# Patient Record
Sex: Female | Born: 1955 | Race: Black or African American | Hispanic: No | Marital: Married | Smoking: Former smoker
Health system: Southern US, Community
[De-identification: ages and names within clinical notes are randomized; demographics above are authoritative.]

## PROBLEM LIST (undated history)

## (undated) DIAGNOSIS — M1990 Unspecified osteoarthritis, unspecified site: Secondary | ICD-10-CM

## (undated) DIAGNOSIS — T7840XA Allergy, unspecified, initial encounter: Secondary | ICD-10-CM

## (undated) DIAGNOSIS — M810 Age-related osteoporosis without current pathological fracture: Secondary | ICD-10-CM

## (undated) DIAGNOSIS — Z923 Personal history of irradiation: Secondary | ICD-10-CM

## (undated) DIAGNOSIS — C50912 Malignant neoplasm of unspecified site of left female breast: Secondary | ICD-10-CM

## (undated) DIAGNOSIS — Z803 Family history of malignant neoplasm of breast: Secondary | ICD-10-CM

## (undated) DIAGNOSIS — I10 Essential (primary) hypertension: Secondary | ICD-10-CM

## (undated) DIAGNOSIS — K219 Gastro-esophageal reflux disease without esophagitis: Secondary | ICD-10-CM

## (undated) DIAGNOSIS — Z9221 Personal history of antineoplastic chemotherapy: Secondary | ICD-10-CM

## (undated) DIAGNOSIS — I5041 Acute combined systolic (congestive) and diastolic (congestive) heart failure: Secondary | ICD-10-CM

## (undated) DIAGNOSIS — D869 Sarcoidosis, unspecified: Secondary | ICD-10-CM

## (undated) DIAGNOSIS — K589 Irritable bowel syndrome without diarrhea: Secondary | ICD-10-CM

## (undated) DIAGNOSIS — N200 Calculus of kidney: Secondary | ICD-10-CM

## (undated) DIAGNOSIS — Z87442 Personal history of urinary calculi: Secondary | ICD-10-CM

## (undated) DIAGNOSIS — F419 Anxiety disorder, unspecified: Secondary | ICD-10-CM

## (undated) DIAGNOSIS — C50219 Malignant neoplasm of upper-inner quadrant of unspecified female breast: Secondary | ICD-10-CM

## (undated) HISTORY — PX: PARTIAL HYSTERECTOMY: SHX80

## (undated) HISTORY — DX: Malignant neoplasm of upper-inner quadrant of unspecified female breast: C50.219

## (undated) HISTORY — DX: Calculus of kidney: N20.0

## (undated) HISTORY — PX: CHOLECYSTECTOMY: SHX55

## (undated) HISTORY — DX: Anxiety disorder, unspecified: F41.9

## (undated) HISTORY — DX: Unspecified osteoarthritis, unspecified site: M19.90

## (undated) HISTORY — DX: Gastro-esophageal reflux disease without esophagitis: K21.9

## (undated) HISTORY — PX: VESICOVAGINAL FISTULA CLOSURE W/ TAH: SUR271

## (undated) HISTORY — DX: Irritable bowel syndrome, unspecified: K58.9

## (undated) HISTORY — DX: Malignant neoplasm of unspecified site of left female breast: C50.912

## (undated) HISTORY — DX: Family history of malignant neoplasm of breast: Z80.3

## (undated) HISTORY — DX: Allergy, unspecified, initial encounter: T78.40XA

## (undated) HISTORY — PX: BREAST LUMPECTOMY: SHX2

## (undated) HISTORY — DX: Age-related osteoporosis without current pathological fracture: M81.0

---

## 1997-07-16 ENCOUNTER — Other Ambulatory Visit: Admission: RE | Admit: 1997-07-16 | Discharge: 1997-07-16 | Payer: Self-pay | Admitting: Obstetrics and Gynecology

## 1998-07-16 ENCOUNTER — Other Ambulatory Visit: Admission: RE | Admit: 1998-07-16 | Discharge: 1998-07-16 | Payer: Self-pay | Admitting: Obstetrics and Gynecology

## 1999-08-28 ENCOUNTER — Other Ambulatory Visit: Admission: RE | Admit: 1999-08-28 | Discharge: 1999-08-28 | Payer: Self-pay | Admitting: Obstetrics and Gynecology

## 2000-08-29 ENCOUNTER — Other Ambulatory Visit: Admission: RE | Admit: 2000-08-29 | Discharge: 2000-08-29 | Payer: Self-pay | Admitting: Obstetrics and Gynecology

## 2000-11-28 ENCOUNTER — Encounter: Payer: Self-pay | Admitting: Obstetrics and Gynecology

## 2000-11-28 ENCOUNTER — Encounter: Admission: RE | Admit: 2000-11-28 | Discharge: 2000-11-28 | Payer: Self-pay | Admitting: Obstetrics and Gynecology

## 2000-12-05 ENCOUNTER — Encounter: Payer: Self-pay | Admitting: General Surgery

## 2000-12-05 ENCOUNTER — Encounter: Admission: RE | Admit: 2000-12-05 | Discharge: 2000-12-05 | Payer: Self-pay | Admitting: General Surgery

## 2000-12-06 ENCOUNTER — Encounter: Payer: Self-pay | Admitting: General Surgery

## 2000-12-06 ENCOUNTER — Encounter (INDEPENDENT_AMBULATORY_CARE_PROVIDER_SITE_OTHER): Payer: Self-pay | Admitting: *Deleted

## 2000-12-06 ENCOUNTER — Ambulatory Visit (HOSPITAL_BASED_OUTPATIENT_CLINIC_OR_DEPARTMENT_OTHER): Admission: RE | Admit: 2000-12-06 | Discharge: 2000-12-07 | Payer: Self-pay | Admitting: General Surgery

## 2000-12-12 ENCOUNTER — Ambulatory Visit: Admission: RE | Admit: 2000-12-12 | Discharge: 2001-03-12 | Payer: Self-pay | Admitting: Radiation Oncology

## 2001-05-04 ENCOUNTER — Ambulatory Visit (HOSPITAL_COMMUNITY): Admission: RE | Admit: 2001-05-04 | Discharge: 2001-05-04 | Payer: Self-pay | Admitting: Oncology

## 2001-05-04 ENCOUNTER — Encounter (HOSPITAL_COMMUNITY): Payer: Self-pay | Admitting: Oncology

## 2001-06-08 ENCOUNTER — Ambulatory Visit (HOSPITAL_COMMUNITY): Admission: RE | Admit: 2001-06-08 | Discharge: 2001-06-08 | Payer: Self-pay | Admitting: Oncology

## 2001-06-08 ENCOUNTER — Encounter (HOSPITAL_COMMUNITY): Payer: Self-pay | Admitting: Oncology

## 2001-06-09 ENCOUNTER — Ambulatory Visit: Admission: RE | Admit: 2001-06-09 | Discharge: 2001-09-07 | Payer: Self-pay | Admitting: Radiation Oncology

## 2001-06-16 ENCOUNTER — Encounter: Admission: RE | Admit: 2001-06-16 | Discharge: 2001-06-16 | Payer: Self-pay | Admitting: Radiation Oncology

## 2001-08-29 ENCOUNTER — Other Ambulatory Visit: Admission: RE | Admit: 2001-08-29 | Discharge: 2001-08-29 | Payer: Self-pay | Admitting: Obstetrics and Gynecology

## 2001-11-27 ENCOUNTER — Encounter (HOSPITAL_COMMUNITY): Payer: Self-pay | Admitting: Oncology

## 2001-11-27 ENCOUNTER — Ambulatory Visit (HOSPITAL_COMMUNITY): Admission: RE | Admit: 2001-11-27 | Discharge: 2001-11-27 | Payer: Self-pay | Admitting: Oncology

## 2001-12-01 ENCOUNTER — Encounter (HOSPITAL_COMMUNITY): Payer: Self-pay | Admitting: Oncology

## 2001-12-01 ENCOUNTER — Encounter: Admission: RE | Admit: 2001-12-01 | Discharge: 2001-12-01 | Payer: Self-pay | Admitting: Oncology

## 2002-03-22 ENCOUNTER — Ambulatory Visit: Admission: RE | Admit: 2002-03-22 | Discharge: 2002-03-22 | Payer: Self-pay | Admitting: Radiation Oncology

## 2002-09-03 ENCOUNTER — Other Ambulatory Visit: Admission: RE | Admit: 2002-09-03 | Discharge: 2002-09-03 | Payer: Self-pay | Admitting: Obstetrics and Gynecology

## 2002-12-03 ENCOUNTER — Encounter (HOSPITAL_COMMUNITY): Payer: Self-pay | Admitting: Oncology

## 2002-12-03 ENCOUNTER — Encounter: Admission: RE | Admit: 2002-12-03 | Discharge: 2002-12-03 | Payer: Self-pay | Admitting: Oncology

## 2002-12-03 ENCOUNTER — Ambulatory Visit (HOSPITAL_COMMUNITY): Admission: RE | Admit: 2002-12-03 | Discharge: 2002-12-03 | Payer: Self-pay | Admitting: Oncology

## 2003-09-03 ENCOUNTER — Other Ambulatory Visit: Admission: RE | Admit: 2003-09-03 | Discharge: 2003-09-03 | Payer: Self-pay | Admitting: Obstetrics and Gynecology

## 2003-12-09 ENCOUNTER — Encounter: Admission: RE | Admit: 2003-12-09 | Discharge: 2003-12-09 | Payer: Self-pay | Admitting: Obstetrics and Gynecology

## 2004-01-19 ENCOUNTER — Ambulatory Visit: Payer: Self-pay | Admitting: Oncology

## 2004-01-20 ENCOUNTER — Ambulatory Visit (HOSPITAL_COMMUNITY): Admission: RE | Admit: 2004-01-20 | Discharge: 2004-01-20 | Payer: Self-pay | Admitting: Oncology

## 2004-05-19 ENCOUNTER — Ambulatory Visit: Payer: Self-pay | Admitting: Oncology

## 2004-09-02 ENCOUNTER — Other Ambulatory Visit: Admission: RE | Admit: 2004-09-02 | Discharge: 2004-09-02 | Payer: Self-pay | Admitting: Obstetrics and Gynecology

## 2004-11-13 ENCOUNTER — Ambulatory Visit: Payer: Self-pay | Admitting: Oncology

## 2004-12-10 ENCOUNTER — Encounter: Admission: RE | Admit: 2004-12-10 | Discharge: 2004-12-10 | Payer: Self-pay | Admitting: Oncology

## 2005-01-06 ENCOUNTER — Ambulatory Visit: Payer: Self-pay | Admitting: Family Medicine

## 2005-02-05 ENCOUNTER — Ambulatory Visit (HOSPITAL_COMMUNITY): Admission: RE | Admit: 2005-02-05 | Discharge: 2005-02-05 | Payer: Self-pay | Admitting: Oncology

## 2005-05-21 ENCOUNTER — Ambulatory Visit: Payer: Self-pay | Admitting: Oncology

## 2005-11-23 ENCOUNTER — Other Ambulatory Visit: Admission: RE | Admit: 2005-11-23 | Discharge: 2005-11-23 | Payer: Self-pay | Admitting: Obstetrics and Gynecology

## 2005-11-30 ENCOUNTER — Ambulatory Visit: Payer: Self-pay | Admitting: Oncology

## 2005-12-02 LAB — CBC WITH DIFFERENTIAL/PLATELET
BASO%: 0.3 % (ref 0.0–2.0)
Basophils Absolute: 0 10*3/uL (ref 0.0–0.1)
EOS%: 1.9 % (ref 0.0–7.0)
MCH: 30.8 pg (ref 26.0–34.0)
MCHC: 33.5 g/dL (ref 32.0–36.0)
MCV: 92 fL (ref 81.0–101.0)
MONO%: 13.4 % — ABNORMAL HIGH (ref 0.0–13.0)
RBC: 3.95 10*6/uL (ref 3.70–5.32)
RDW: 13.7 % (ref 11.3–14.5)
lymph#: 1 10*3/uL (ref 0.9–3.3)

## 2005-12-02 LAB — COMPREHENSIVE METABOLIC PANEL
ALT: 19 U/L (ref 0–40)
AST: 28 U/L (ref 0–37)
Albumin: 4.3 g/dL (ref 3.5–5.2)
Alkaline Phosphatase: 48 U/L (ref 39–117)
BUN: 15 mg/dL (ref 6–23)
Chloride: 103 mEq/L (ref 96–112)
Potassium: 4.3 mEq/L (ref 3.5–5.3)
Sodium: 142 mEq/L (ref 135–145)

## 2005-12-15 ENCOUNTER — Encounter: Admission: RE | Admit: 2005-12-15 | Discharge: 2005-12-15 | Payer: Self-pay | Admitting: Obstetrics and Gynecology

## 2005-12-28 ENCOUNTER — Encounter: Admission: RE | Admit: 2005-12-28 | Discharge: 2005-12-28 | Payer: Self-pay | Admitting: Oncology

## 2006-04-04 ENCOUNTER — Ambulatory Visit (HOSPITAL_COMMUNITY): Admission: RE | Admit: 2006-04-04 | Discharge: 2006-04-04 | Payer: Self-pay | Admitting: Oncology

## 2006-05-30 ENCOUNTER — Ambulatory Visit: Payer: Self-pay | Admitting: Oncology

## 2006-06-01 LAB — COMPREHENSIVE METABOLIC PANEL
Alkaline Phosphatase: 72 U/L (ref 39–117)
BUN: 15 mg/dL (ref 6–23)
Glucose, Bld: 76 mg/dL (ref 70–99)
Sodium: 139 mEq/L (ref 135–145)
Total Bilirubin: 0.4 mg/dL (ref 0.3–1.2)

## 2006-06-01 LAB — CBC WITH DIFFERENTIAL/PLATELET
Basophils Absolute: 0 10*3/uL (ref 0.0–0.1)
EOS%: 2.8 % (ref 0.0–7.0)
Eosinophils Absolute: 0.1 10*3/uL (ref 0.0–0.5)
LYMPH%: 17.1 % (ref 14.0–48.0)
MCH: 31 pg (ref 26.0–34.0)
MCV: 90.9 fL (ref 81.0–101.0)
MONO%: 4.3 % (ref 0.0–13.0)
NEUT#: 3.7 10*3/uL (ref 1.5–6.5)
Platelets: 224 10*3/uL (ref 145–400)
RBC: 4.18 10*6/uL (ref 3.70–5.32)

## 2006-12-23 ENCOUNTER — Encounter: Admission: RE | Admit: 2006-12-23 | Discharge: 2006-12-23 | Payer: Self-pay | Admitting: Obstetrics and Gynecology

## 2006-12-26 ENCOUNTER — Ambulatory Visit: Payer: Self-pay | Admitting: Oncology

## 2006-12-28 LAB — COMPREHENSIVE METABOLIC PANEL
CO2: 26 mEq/L (ref 19–32)
Creatinine, Ser: 0.88 mg/dL (ref 0.40–1.20)
Glucose, Bld: 97 mg/dL (ref 70–99)
Sodium: 140 mEq/L (ref 135–145)
Total Bilirubin: 0.4 mg/dL (ref 0.3–1.2)
Total Protein: 7.2 g/dL (ref 6.0–8.3)

## 2006-12-28 LAB — CBC WITH DIFFERENTIAL/PLATELET
Basophils Absolute: 0 10*3/uL (ref 0.0–0.1)
Eosinophils Absolute: 0.1 10*3/uL (ref 0.0–0.5)
HCT: 35.4 % (ref 34.8–46.6)
LYMPH%: 31.5 % (ref 14.0–48.0)
MONO#: 0.2 10*3/uL (ref 0.1–0.9)
NEUT#: 2 10*3/uL (ref 1.5–6.5)
NEUT%: 57.2 % (ref 39.6–76.8)
Platelets: 274 10*3/uL (ref 145–400)
WBC: 3.5 10*3/uL — ABNORMAL LOW (ref 3.9–10.0)

## 2006-12-28 LAB — LACTATE DEHYDROGENASE: LDH: 189 U/L (ref 94–250)

## 2007-06-27 ENCOUNTER — Ambulatory Visit: Payer: Self-pay | Admitting: Oncology

## 2007-06-29 LAB — CBC WITH DIFFERENTIAL/PLATELET
BASO%: 1.3 % (ref 0.0–2.0)
Basophils Absolute: 0.1 10*3/uL (ref 0.0–0.1)
EOS%: 3.5 % (ref 0.0–7.0)
HCT: 36.5 % (ref 34.8–46.6)
HGB: 12.1 g/dL (ref 11.6–15.9)
MCH: 30.2 pg (ref 26.0–34.0)
MCHC: 33.1 g/dL (ref 32.0–36.0)
MONO#: 0.2 10*3/uL (ref 0.1–0.9)
NEUT%: 56.6 % (ref 39.6–76.8)
RDW: 12.8 % (ref 11.3–14.5)
WBC: 4.2 10*3/uL (ref 3.9–10.0)
lymph#: 1.4 10*3/uL (ref 0.9–3.3)

## 2007-06-29 LAB — LACTATE DEHYDROGENASE: LDH: 180 U/L (ref 94–250)

## 2007-06-29 LAB — COMPREHENSIVE METABOLIC PANEL
ALT: 21 U/L (ref 0–35)
AST: 26 U/L (ref 0–37)
Albumin: 4 g/dL (ref 3.5–5.2)
CO2: 24 mEq/L (ref 19–32)
Calcium: 8.9 mg/dL (ref 8.4–10.5)
Chloride: 104 mEq/L (ref 96–112)
Creatinine, Ser: 0.89 mg/dL (ref 0.40–1.20)
Potassium: 3.9 mEq/L (ref 3.5–5.3)
Total Protein: 7.1 g/dL (ref 6.0–8.3)

## 2007-07-12 ENCOUNTER — Encounter: Admission: RE | Admit: 2007-07-12 | Discharge: 2007-07-12 | Payer: Self-pay | Admitting: Oncology

## 2007-12-27 ENCOUNTER — Ambulatory Visit: Payer: Self-pay | Admitting: Oncology

## 2007-12-29 ENCOUNTER — Encounter: Admission: RE | Admit: 2007-12-29 | Discharge: 2007-12-29 | Payer: Self-pay | Admitting: Obstetrics and Gynecology

## 2007-12-29 LAB — CBC WITH DIFFERENTIAL/PLATELET
Basophils Absolute: 0 10*3/uL (ref 0.0–0.1)
Eosinophils Absolute: 0.1 10*3/uL (ref 0.0–0.5)
HCT: 37.8 % (ref 34.8–46.6)
HGB: 12.5 g/dL (ref 11.6–15.9)
LYMPH%: 22.5 % (ref 14.0–48.0)
MONO#: 0.2 10*3/uL (ref 0.1–0.9)
NEUT#: 2.8 10*3/uL (ref 1.5–6.5)
NEUT%: 67 % (ref 39.6–76.8)
Platelets: 206 10*3/uL (ref 145–400)
WBC: 4.1 10*3/uL (ref 3.9–10.0)
lymph#: 0.9 10*3/uL (ref 0.9–3.3)

## 2007-12-29 LAB — COMPREHENSIVE METABOLIC PANEL
ALT: 25 U/L (ref 0–35)
CO2: 23 mEq/L (ref 19–32)
Calcium: 9.4 mg/dL (ref 8.4–10.5)
Chloride: 107 mEq/L (ref 96–112)
Creatinine, Ser: 0.98 mg/dL (ref 0.40–1.20)
Glucose, Bld: 112 mg/dL — ABNORMAL HIGH (ref 70–99)
Total Bilirubin: 0.5 mg/dL (ref 0.3–1.2)

## 2007-12-29 LAB — LACTATE DEHYDROGENASE: LDH: 271 U/L — ABNORMAL HIGH (ref 94–250)

## 2008-01-29 ENCOUNTER — Encounter: Admission: RE | Admit: 2008-01-29 | Discharge: 2008-01-29 | Payer: Self-pay | Admitting: Obstetrics and Gynecology

## 2008-01-29 LAB — LACTATE DEHYDROGENASE: LDH: 178 U/L (ref 94–250)

## 2008-12-23 ENCOUNTER — Ambulatory Visit: Payer: Self-pay | Admitting: Oncology

## 2008-12-25 LAB — CBC WITH DIFFERENTIAL/PLATELET
Basophils Absolute: 0 10*3/uL (ref 0.0–0.1)
EOS%: 3.2 % (ref 0.0–7.0)
HGB: 12.1 g/dL (ref 11.6–15.9)
LYMPH%: 27.3 % (ref 14.0–49.7)
MCH: 30.5 pg (ref 25.1–34.0)
MCV: 90.3 fL (ref 79.5–101.0)
MONO%: 10.5 % (ref 0.0–14.0)
Platelets: 259 10*3/uL (ref 145–400)
RBC: 3.97 10*6/uL (ref 3.70–5.45)
RDW: 13.9 % (ref 11.2–14.5)

## 2008-12-25 LAB — COMPREHENSIVE METABOLIC PANEL
AST: 28 U/L (ref 0–37)
Albumin: 4 g/dL (ref 3.5–5.2)
Alkaline Phosphatase: 59 U/L (ref 39–117)
BUN: 13 mg/dL (ref 6–23)
Creatinine, Ser: 0.96 mg/dL (ref 0.40–1.20)
Potassium: 3.5 mEq/L (ref 3.5–5.3)
Total Bilirubin: 0.7 mg/dL (ref 0.3–1.2)

## 2008-12-30 ENCOUNTER — Encounter: Admission: RE | Admit: 2008-12-30 | Discharge: 2008-12-30 | Payer: Self-pay | Admitting: Oncology

## 2009-12-23 ENCOUNTER — Ambulatory Visit: Payer: Self-pay | Admitting: Oncology

## 2009-12-25 LAB — CBC WITH DIFFERENTIAL/PLATELET
BASO%: 0.7 % (ref 0.0–2.0)
Basophils Absolute: 0 10*3/uL (ref 0.0–0.1)
EOS%: 4.6 % (ref 0.0–7.0)
Eosinophils Absolute: 0.1 10*3/uL (ref 0.0–0.5)
HCT: 38.5 % (ref 34.8–46.6)
HGB: 12.6 g/dL (ref 11.6–15.9)
LYMPH%: 39.1 % (ref 14.0–49.7)
MCH: 30.5 pg (ref 25.1–34.0)
MCHC: 32.7 g/dL (ref 31.5–36.0)
MCV: 93.2 fL (ref 79.5–101.0)
MONO#: 0.3 10*3/uL (ref 0.1–0.9)
MONO%: 9.1 % (ref 0.0–14.0)
NEUT#: 1.4 10*3/uL — ABNORMAL LOW (ref 1.5–6.5)
NEUT%: 46.5 % (ref 38.4–76.8)
Platelets: 245 10*3/uL (ref 145–400)
RBC: 4.13 10*6/uL (ref 3.70–5.45)
RDW: 13.3 % (ref 11.2–14.5)
WBC: 3.1 10*3/uL — ABNORMAL LOW (ref 3.9–10.3)
lymph#: 1.2 10*3/uL (ref 0.9–3.3)
nRBC: 0 % (ref 0–0)

## 2009-12-25 LAB — COMPREHENSIVE METABOLIC PANEL WITH GFR
ALT: 21 U/L (ref 0–35)
AST: 34 U/L (ref 0–37)
Albumin: 4.1 g/dL (ref 3.5–5.2)
Alkaline Phosphatase: 60 U/L (ref 39–117)
BUN: 13 mg/dL (ref 6–23)
CO2: 27 meq/L (ref 19–32)
Calcium: 9.6 mg/dL (ref 8.4–10.5)
Chloride: 105 meq/L (ref 96–112)
Creatinine, Ser: 0.94 mg/dL (ref 0.40–1.20)
Glucose, Bld: 74 mg/dL (ref 70–99)
Potassium: 3.6 meq/L (ref 3.5–5.3)
Sodium: 140 meq/L (ref 135–145)
Total Bilirubin: 0.4 mg/dL (ref 0.3–1.2)
Total Protein: 7.6 g/dL (ref 6.0–8.3)

## 2009-12-25 LAB — LACTATE DEHYDROGENASE: LDH: 173 U/L (ref 94–250)

## 2009-12-31 ENCOUNTER — Encounter: Admission: RE | Admit: 2009-12-31 | Discharge: 2009-12-31 | Payer: Self-pay | Admitting: Oncology

## 2010-02-10 ENCOUNTER — Ambulatory Visit: Payer: Self-pay | Admitting: Oncology

## 2010-02-10 LAB — CBC WITH DIFFERENTIAL/PLATELET
BASO%: 1.5 % (ref 0.0–2.0)
Basophils Absolute: 0.1 10*3/uL (ref 0.0–0.1)
EOS%: 6.8 % (ref 0.0–7.0)
Eosinophils Absolute: 0.2 10*3/uL (ref 0.0–0.5)
HCT: 40.2 % (ref 34.8–46.6)
HGB: 13.1 g/dL (ref 11.6–15.9)
LYMPH%: 29.4 % (ref 14.0–49.7)
MCH: 30.3 pg (ref 25.1–34.0)
MCHC: 32.6 g/dL (ref 31.5–36.0)
MCV: 93.1 fL (ref 79.5–101.0)
MONO#: 0.3 10*3/uL (ref 0.1–0.9)
MONO%: 7.7 % (ref 0.0–14.0)
NEUT#: 1.8 10*3/uL (ref 1.5–6.5)
NEUT%: 54.6 % (ref 38.4–76.8)
Platelets: 279 10*3/uL (ref 145–400)
RBC: 4.32 10*6/uL (ref 3.70–5.45)
RDW: 13 % (ref 11.2–14.5)
WBC: 3.2 10*3/uL — ABNORMAL LOW (ref 3.9–10.3)
lymph#: 1 10*3/uL (ref 0.9–3.3)
nRBC: 0 % (ref 0–0)

## 2010-02-17 ENCOUNTER — Encounter: Admission: RE | Admit: 2010-02-17 | Discharge: 2010-02-17 | Payer: Self-pay | Admitting: Obstetrics and Gynecology

## 2010-04-05 ENCOUNTER — Encounter (HOSPITAL_COMMUNITY): Payer: Self-pay | Admitting: Oncology

## 2010-06-29 ENCOUNTER — Encounter (HOSPITAL_BASED_OUTPATIENT_CLINIC_OR_DEPARTMENT_OTHER): Payer: Managed Care, Other (non HMO) | Admitting: Oncology

## 2010-06-29 ENCOUNTER — Other Ambulatory Visit (HOSPITAL_COMMUNITY): Payer: Self-pay | Admitting: Oncology

## 2010-06-29 ENCOUNTER — Ambulatory Visit (HOSPITAL_COMMUNITY)
Admission: RE | Admit: 2010-06-29 | Discharge: 2010-06-29 | Disposition: A | Discharge status: Home / Self Care | Payer: Managed Care, Other (non HMO) | Source: Ambulatory Visit | Attending: Oncology | Admitting: Oncology

## 2010-06-29 DIAGNOSIS — M810 Age-related osteoporosis without current pathological fracture: Secondary | ICD-10-CM

## 2010-06-29 DIAGNOSIS — C50919 Malignant neoplasm of unspecified site of unspecified female breast: Secondary | ICD-10-CM | POA: Insufficient documentation

## 2010-06-29 DIAGNOSIS — Z853 Personal history of malignant neoplasm of breast: Secondary | ICD-10-CM

## 2010-06-29 LAB — COMPREHENSIVE METABOLIC PANEL
ALT: 20 U/L (ref 0–35)
AST: 32 U/L (ref 0–37)
AST: 32 U/L (ref 0–37)
Albumin: 3.9 g/dL (ref 3.5–5.2)
Alkaline Phosphatase: 50 U/L (ref 39–117)
BUN: 12 mg/dL (ref 6–23)
BUN: 12 mg/dL (ref 6–23)
CO2: 31 mEq/L (ref 19–32)
Calcium: 9.1 mg/dL (ref 8.4–10.5)
Chloride: 104 mEq/L (ref 96–112)
Creatinine, Ser: 0.76 mg/dL (ref 0.40–1.20)
Creatinine, Ser: 0.76 mg/dL (ref 0.40–1.20)
Glucose, Bld: 80 mg/dL (ref 70–99)
Potassium: 3.4 mEq/L — ABNORMAL LOW (ref 3.5–5.3)
Total Bilirubin: 0.6 mg/dL (ref 0.3–1.2)

## 2010-06-29 LAB — CBC WITH DIFFERENTIAL/PLATELET
BASO%: 0.6 % (ref 0.0–2.0)
Basophils Absolute: 0 10*3/uL (ref 0.0–0.1)
EOS%: 4.1 % (ref 0.0–7.0)
HCT: 36.3 % (ref 34.8–46.6)
HGB: 12.3 g/dL (ref 11.6–15.9)
MCH: 31.3 pg (ref 25.1–34.0)
MCHC: 33.8 g/dL (ref 31.5–36.0)
MCV: 92.7 fL (ref 79.5–101.0)
MONO%: 5.8 % (ref 0.0–14.0)
NEUT%: 56.8 % (ref 38.4–76.8)
RDW: 13.6 % (ref 11.2–14.5)
lymph#: 1 10*3/uL (ref 0.9–3.3)

## 2010-06-29 LAB — LACTATE DEHYDROGENASE: LDH: 166 U/L (ref 94–250)

## 2010-07-01 ENCOUNTER — Other Ambulatory Visit (HOSPITAL_COMMUNITY): Payer: Self-pay | Admitting: Oncology

## 2010-07-01 DIAGNOSIS — C50919 Malignant neoplasm of unspecified site of unspecified female breast: Secondary | ICD-10-CM

## 2010-07-01 DIAGNOSIS — R911 Solitary pulmonary nodule: Secondary | ICD-10-CM

## 2010-07-01 DIAGNOSIS — R9389 Abnormal findings on diagnostic imaging of other specified body structures: Secondary | ICD-10-CM

## 2010-07-02 ENCOUNTER — Ambulatory Visit
Admission: RE | Admit: 2010-07-02 | Discharge: 2010-07-02 | Disposition: A | Discharge status: Home / Self Care | Payer: Managed Care, Other (non HMO) | Source: Ambulatory Visit | Attending: Oncology | Admitting: Oncology

## 2010-07-02 DIAGNOSIS — C50919 Malignant neoplasm of unspecified site of unspecified female breast: Secondary | ICD-10-CM

## 2010-07-02 DIAGNOSIS — R911 Solitary pulmonary nodule: Secondary | ICD-10-CM

## 2010-07-02 DIAGNOSIS — R9389 Abnormal findings on diagnostic imaging of other specified body structures: Secondary | ICD-10-CM

## 2010-07-02 MED ORDER — IOHEXOL 300 MG/ML  SOLN: 50.0000 mL | Freq: Once | INTRAMUSCULAR | Status: AC | PRN

## 2010-07-06 ENCOUNTER — Other Ambulatory Visit (HOSPITAL_COMMUNITY): Payer: Self-pay | Admitting: Oncology

## 2010-07-06 DIAGNOSIS — R911 Solitary pulmonary nodule: Secondary | ICD-10-CM

## 2010-07-31 NOTE — Op Note (Signed)
[place]. [facility]  Patient:    JUMANAH, HYNSON Visit Number: [id number] MRN: [id number]          Service Type: DSU Location: [facility] Attending Physician:  Carson Myrtle Dictated by:   Sheppard Plumber Earlene Plater, M.D. Proc. Date: 12/06/00 Admit Date:  12/06/2000                             Operative Report  PREOPERATIVE DIAGNOSIS:  Left breast cancer.  POSTOPERATIVE DIAGNOSIS:  Left breast cancer.  OPERATION PERFORMED:  Sentinel node biopsy left axilla and partial mastectomy, left breast.  SURGEON:  Timothy E. Earlene Plater, M.D.  ANESTHESIA:  General.  INDICATIONS FOR PROCEDURE:  The patient is otherwise healthy age 55 black female recently discovered a mass in the left breast and after persistence and special view mammography, Dr. 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  The patient has been carefully counseled and she is ready to proceed with this planned procedure.   DESCRIPTION OF PROCEDURE:  The patient had been evaluated by anesthesia. Laboratory data including chemistry profile, CBC, chest x-ray were normal. She was injected in nuclear medicine at 9:15 a.m. and the surgical case began at 11 a.m.  The patient was in the operating room under general anesthesia, the left breast was carefully examined.  The mass marked in the upper inner quadrant and the left axilla skin crease marked.  She was injected with dye around the tumor and the nipple and this was massaged well for five minutes. Then the entire left breast and chest axilla were prepped and draped in the usual fashion.  Marcaine 0.25% with epinephrine was injected prior to each incision.  The axilla was approached first by first scanning the chest, medial chest, supraclavicular area where no counts were found.  The tumor bed counts were in the range of 2000 to 3000.  There was one spot in the axilla under the pectoralis major  area that had a high count of approximately 300.  A short incision was made there.  The tissue dissected, the hot spot identified. Several stained lymphatic channels were followed to a single lymph node which was singularly dissected out and this counted only 40 to 50 counts.  Behind that was a hotter node counting 300 counts.  This too was isolated.  The second hot node was submitted as #1 both hot and blue and the second lymph node with lower counts was submitted as node #2.  Bleeding was controlled with a cautery.  Clips were applied in the lymph node bed area and the wound was closed.  Then attention was turned to the upper inner quadrant of the left breast where the tumor mass was palpable.  A generous incision was made in curvilinear fashion of the skin.  The superficial subcu, deep subcu, breast tissue all about the tumor including the pectoral fascia was completely removed.  The resulting disk of tissue was approximately 8 cm in diameter.  I submitted this to pathology for their gross approval.  They did not see evidence to do frozen sections.  Bleeding was controlled with the cautery.  The breast tissue was gently reapproximated with 3-0 Vicryl and the skin was closed with layers of 3-0 Monocryl and 4-0 Monocryl.  Steri-Strips applied, final counts correct. The patient had tolerated it well, was awakened and taken to the recovery room in good condition.  The  two sentinel lymph nodes #1 and #2 were both negative for tumor.  The patient will be admitted overnight for observation and then discharged and followed as an outpatient. Dictated by:   Sheppard Plumber Earlene Plater, M.D. Attending Physician:  Carson Myrtle DD:  12/06/00 TD:  12/06/00 Job: [id number]

## 2010-10-01 ENCOUNTER — Encounter (HOSPITAL_BASED_OUTPATIENT_CLINIC_OR_DEPARTMENT_OTHER): Payer: Managed Care, Other (non HMO) | Admitting: Oncology

## 2010-10-01 ENCOUNTER — Other Ambulatory Visit (HOSPITAL_COMMUNITY): Payer: Self-pay | Admitting: Oncology

## 2010-10-01 ENCOUNTER — Ambulatory Visit
Admission: RE | Admit: 2010-10-01 | Discharge: 2010-10-01 | Disposition: A | Discharge status: Home / Self Care | Payer: Managed Care, Other (non HMO) | Source: Ambulatory Visit | Attending: Oncology | Admitting: Oncology

## 2010-10-01 DIAGNOSIS — M810 Age-related osteoporosis without current pathological fracture: Secondary | ICD-10-CM

## 2010-10-01 DIAGNOSIS — R911 Solitary pulmonary nodule: Secondary | ICD-10-CM

## 2010-10-01 DIAGNOSIS — Z853 Personal history of malignant neoplasm of breast: Secondary | ICD-10-CM

## 2010-10-01 LAB — BASIC METABOLIC PANEL
BUN: 12 mg/dL (ref 6–23)
CO2: 31 mEq/L (ref 19–32)
Chloride: 99 mEq/L (ref 96–112)
Creatinine, Ser: 0.94 mg/dL (ref 0.50–1.10)

## 2010-10-01 MED ORDER — IOHEXOL 300 MG/ML  SOLN
75.0000 mL | Freq: Once | INTRAMUSCULAR | Status: AC | PRN
  Administered 2010-10-01: 75 mL via INTRAVENOUS

## 2010-10-29 ENCOUNTER — Other Ambulatory Visit (HOSPITAL_COMMUNITY): Payer: Self-pay | Admitting: Oncology

## 2010-10-29 DIAGNOSIS — Z1231 Encounter for screening mammogram for malignant neoplasm of breast: Secondary | ICD-10-CM

## 2010-11-05 ENCOUNTER — Other Ambulatory Visit: Payer: Self-pay | Admitting: Unknown Physician Specialty

## 2010-11-05 DIAGNOSIS — R110 Nausea: Secondary | ICD-10-CM

## 2010-11-09 ENCOUNTER — Ambulatory Visit
Admission: RE | Admit: 2010-11-09 | Discharge: 2010-11-09 | Disposition: A | Discharge status: Home / Self Care | Payer: Managed Care, Other (non HMO) | Source: Ambulatory Visit | Attending: Unknown Physician Specialty | Admitting: Unknown Physician Specialty

## 2010-11-09 DIAGNOSIS — R110 Nausea: Secondary | ICD-10-CM

## 2010-11-11 DIAGNOSIS — R918 Other nonspecific abnormal finding of lung field: Secondary | ICD-10-CM

## 2010-11-11 DIAGNOSIS — C50912 Malignant neoplasm of unspecified site of left female breast: Secondary | ICD-10-CM

## 2010-11-11 DIAGNOSIS — C50219 Malignant neoplasm of upper-inner quadrant of unspecified female breast: Secondary | ICD-10-CM

## 2010-11-12 ENCOUNTER — Other Ambulatory Visit: Payer: Self-pay | Admitting: Thoracic Surgery

## 2010-11-12 ENCOUNTER — Institutional Professional Consult (permissible substitution) (INDEPENDENT_AMBULATORY_CARE_PROVIDER_SITE_OTHER): Payer: Managed Care, Other (non HMO) | Admitting: Thoracic Surgery

## 2010-11-12 VITALS — BP 111/73 | HR 62 | Resp 18 | Ht 60.0 in | Wt 139.0 lb

## 2010-11-12 DIAGNOSIS — K8020 Calculus of gallbladder without cholecystitis without obstruction: Secondary | ICD-10-CM | POA: Insufficient documentation

## 2010-11-12 DIAGNOSIS — C50219 Malignant neoplasm of upper-inner quadrant of unspecified female breast: Secondary | ICD-10-CM

## 2010-11-12 DIAGNOSIS — N200 Calculus of kidney: Secondary | ICD-10-CM | POA: Insufficient documentation

## 2010-11-12 DIAGNOSIS — R918 Other nonspecific abnormal finding of lung field: Secondary | ICD-10-CM

## 2010-11-12 NOTE — Progress Notes (Signed)
HPI this 55 year old African American female was found to have breast cancer 10 years ago. She underwent lumpectomy and radiation. There also was a 5.4 mm nodule in the left upper lobe. The repeat CT scan in August showed that the left upper lobe nodule is unchanged. She is referred here for evaluation of the left upper lobe nodule. I explained to her that the nodule was too small to biopsy at the present time. It also would be unable to be visualized on PET scan. I explained to her the only option was continued prior. Since it had not changed in 3 months we will wait 6 months for her next CT scan. The CT scan with super dimension protocol f   Current Outpatient Prescriptions  Medication Sig Dispense Refill  . alendronate (FOSAMAX) 70 MG tablet Take 70 mg by mouth every 7 (seven) days. Take with a full glass of water on an empty stomach.       . calcium-vitamin D (OSCAL WITH D) 500-200 MG-UNIT per tablet Take 1 tablet by mouth daily.        . cetirizine (ZYRTEC) 10 MG chewable tablet Chew 10 mg by mouth daily.        Marland Kitchen dicyclomine (BENTYL) 20 MG tablet Take 20 mg by mouth every 6 (six) hours.        . Multiple Vitamin (MULTIVITAMIN) tablet Take 1 tablet by mouth daily.        . Naproxen Sodium (ALEVE) 220 MG CAPS Take 1 capsule by mouth 2 (two) times daily as needed.        Marland Kitchen omeprazole (PRILOSEC) 20 MG capsule Take 20 mg by mouth daily.        . polyethylene glycol powder (GLYCOLAX/MIRALAX) powder Take 17 g by mouth daily.           Physical Exam  Constitutional: She is oriented to person, place, and time. She appears well-developed and well-nourished.  HENT:  Head: Normocephalic and atraumatic.  Left Ear: External ear normal.  Eyes: EOM are normal. Pupils are equal, round, and reactive to light.  Neck: Normal range of motion. Neck supple.  Cardiovascular: Normal rate and regular rhythm.   No murmur heard. Pulmonary/Chest: Effort normal and breath sounds normal.  Abdominal: Soft. Bowel  sounds are normal. She exhibits no mass.  Musculoskeletal: Normal range of motion.  Lymphadenopathy:    She has cervical adenopathy.  Neurological: She is alert and oriented to person, place, and time.  Skin: Skin is warm and dry.  Psychiatric: She has a normal mood and affect. Her behavior is normal. Judgment and thought content normal.     Diagnostic tests: CT scan showed a 5.35mm nodule in the left upper lobe  Impression: Left upper lobe nodule the   Plan: Follow up CT scan in 6 months.

## 2010-12-31 ENCOUNTER — Other Ambulatory Visit: Payer: Managed Care, Other (non HMO)

## 2011-01-04 ENCOUNTER — Other Ambulatory Visit (HOSPITAL_COMMUNITY): Payer: Self-pay | Admitting: Oncology

## 2011-01-04 ENCOUNTER — Encounter (HOSPITAL_BASED_OUTPATIENT_CLINIC_OR_DEPARTMENT_OTHER): Payer: Managed Care, Other (non HMO) | Admitting: Oncology

## 2011-01-04 ENCOUNTER — Ambulatory Visit
Admission: RE | Admit: 2011-01-04 | Discharge: 2011-01-04 | Disposition: A | Discharge status: Home / Self Care | Payer: Managed Care, Other (non HMO) | Source: Ambulatory Visit | Attending: Oncology | Admitting: Oncology

## 2011-01-04 DIAGNOSIS — Z9010 Acquired absence of unspecified breast and nipple: Secondary | ICD-10-CM

## 2011-01-04 DIAGNOSIS — Z853 Personal history of malignant neoplasm of breast: Secondary | ICD-10-CM

## 2011-01-04 DIAGNOSIS — M810 Age-related osteoporosis without current pathological fracture: Secondary | ICD-10-CM

## 2011-01-04 DIAGNOSIS — Z1231 Encounter for screening mammogram for malignant neoplasm of breast: Secondary | ICD-10-CM

## 2011-01-04 LAB — CBC WITH DIFFERENTIAL/PLATELET
Basophils Absolute: 0 10*3/uL (ref 0.0–0.1)
Eosinophils Absolute: 0.3 10*3/uL (ref 0.0–0.5)
HCT: 37.7 % (ref 34.8–46.6)
HGB: 12.2 g/dL (ref 11.6–15.9)
LYMPH%: 30.9 % (ref 14.0–49.7)
MONO#: 0.3 10*3/uL (ref 0.1–0.9)
NEUT#: 1.8 10*3/uL (ref 1.5–6.5)
Platelets: 238 10*3/uL (ref 145–400)
RBC: 4.09 10*6/uL (ref 3.70–5.45)
WBC: 3.4 10*3/uL — ABNORMAL LOW (ref 3.9–10.3)
nRBC: 0 % (ref 0–0)

## 2011-01-04 LAB — COMPREHENSIVE METABOLIC PANEL
ALT: 19 U/L (ref 0–35)
CO2: 27 mEq/L (ref 19–32)
Calcium: 9.2 mg/dL (ref 8.4–10.5)
Chloride: 102 mEq/L (ref 96–112)
Creatinine, Ser: 0.86 mg/dL (ref 0.50–1.10)
Total Protein: 7.4 g/dL (ref 6.0–8.3)

## 2011-01-04 LAB — LACTATE DEHYDROGENASE: LDH: 188 U/L (ref 94–250)

## 2011-03-25 ENCOUNTER — Other Ambulatory Visit: Payer: Self-pay | Admitting: Thoracic Surgery

## 2011-03-25 DIAGNOSIS — D381 Neoplasm of uncertain behavior of trachea, bronchus and lung: Secondary | ICD-10-CM

## 2011-05-11 ENCOUNTER — Ambulatory Visit
Admission: RE | Admit: 2011-05-11 | Discharge: 2011-05-11 | Disposition: A | Discharge status: Home / Self Care | Payer: Managed Care, Other (non HMO) | Source: Ambulatory Visit | Attending: Thoracic Surgery | Admitting: Thoracic Surgery

## 2011-05-11 ENCOUNTER — Encounter: Payer: Self-pay | Admitting: Thoracic Surgery

## 2011-05-11 ENCOUNTER — Ambulatory Visit (INDEPENDENT_AMBULATORY_CARE_PROVIDER_SITE_OTHER): Payer: Managed Care, Other (non HMO) | Admitting: Thoracic Surgery

## 2011-05-11 DIAGNOSIS — C50919 Malignant neoplasm of unspecified site of unspecified female breast: Secondary | ICD-10-CM

## 2011-05-11 DIAGNOSIS — D381 Neoplasm of uncertain behavior of trachea, bronchus and lung: Secondary | ICD-10-CM

## 2011-05-11 NOTE — Progress Notes (Signed)
HPI the patient returns for a CT scan. CT scan shows no change. The nodules as well as the scarring in the left upper lobe are unchanged. Since his been followed for approximately that these are probably benign. I will refer her back to her medical Dr. for long-term followup. He will continue to see Dr. Arline Asp.  Current Outpatient Prescriptions  Medication Sig Dispense Refill  . alendronate (FOSAMAX) 70 MG tablet Take 70 mg by mouth every 7 (seven) days. Take with a full glass of water on an empty stomach.       . calcium-vitamin D (OSCAL WITH D) 500-200 MG-UNIT per tablet Take 1 tablet by mouth daily.        . cetirizine (ZYRTEC) 10 MG chewable tablet Chew 10 mg by mouth daily.        Marland Kitchen dicyclomine (BENTYL) 20 MG tablet Take 20 mg by mouth every 6 (six) hours.        . Multiple Vitamin (MULTIVITAMIN) tablet Take 1 tablet by mouth daily.        . Naproxen Sodium (ALEVE) 220 MG CAPS Take 1 capsule by mouth 2 (two) times daily as needed.        Marland Kitchen omeprazole (PRILOSEC) 20 MG capsule Take 20 mg by mouth daily.        . polyethylene glycol powder (GLYCOLAX/MIRALAX) powder Take 17 g by mouth daily.          Review of Systems: Unchanged   Physical Exam lungs are clear to auscultation percussion   Diagnostic Tests: CT scan shows no evidence of recurrence of her cancer lung nodules are stable   Impression: Breast cancer left breast status post radiation and chemotherapy bilateral pulmonary nodules in Plan: Followup Dr. Arline Asp

## 2011-07-05 ENCOUNTER — Ambulatory Visit (HOSPITAL_BASED_OUTPATIENT_CLINIC_OR_DEPARTMENT_OTHER): Payer: Managed Care, Other (non HMO) | Admitting: Oncology

## 2011-07-05 ENCOUNTER — Other Ambulatory Visit: Payer: Self-pay | Admitting: Oncology

## 2011-07-05 ENCOUNTER — Other Ambulatory Visit (HOSPITAL_BASED_OUTPATIENT_CLINIC_OR_DEPARTMENT_OTHER): Payer: Managed Care, Other (non HMO) | Admitting: Lab

## 2011-07-05 ENCOUNTER — Telehealth: Payer: Self-pay | Admitting: Medical Oncology

## 2011-07-05 ENCOUNTER — Other Ambulatory Visit: Payer: Self-pay | Admitting: Medical Oncology

## 2011-07-05 ENCOUNTER — Encounter: Payer: Self-pay | Admitting: Oncology

## 2011-07-05 VITALS — BP 125/71 | HR 56 | Temp 97.3°F | Ht 60.0 in | Wt 152.0 lb

## 2011-07-05 DIAGNOSIS — M810 Age-related osteoporosis without current pathological fracture: Secondary | ICD-10-CM

## 2011-07-05 DIAGNOSIS — N289 Disorder of kidney and ureter, unspecified: Secondary | ICD-10-CM

## 2011-07-05 DIAGNOSIS — R918 Other nonspecific abnormal finding of lung field: Secondary | ICD-10-CM

## 2011-07-05 DIAGNOSIS — Z853 Personal history of malignant neoplasm of breast: Secondary | ICD-10-CM

## 2011-07-05 DIAGNOSIS — C50912 Malignant neoplasm of unspecified site of left female breast: Secondary | ICD-10-CM

## 2011-07-05 DIAGNOSIS — N281 Cyst of kidney, acquired: Secondary | ICD-10-CM

## 2011-07-05 LAB — COMPREHENSIVE METABOLIC PANEL
AST: 34 U/L (ref 0–37)
Albumin: 4.3 g/dL (ref 3.5–5.2)
Alkaline Phosphatase: 76 U/L (ref 39–117)
Potassium: 4.3 mEq/L (ref 3.5–5.3)
Sodium: 139 mEq/L (ref 135–145)
Total Protein: 6.8 g/dL (ref 6.0–8.3)

## 2011-07-05 LAB — CBC WITH DIFFERENTIAL/PLATELET
BASO%: 1 % (ref 0.0–2.0)
EOS%: 6 % (ref 0.0–7.0)
HGB: 12.6 g/dL (ref 11.6–15.9)
MCH: 30.8 pg (ref 25.1–34.0)
MCHC: 32.7 g/dL (ref 31.5–36.0)
RDW: 13.8 % (ref 11.2–14.5)
lymph#: 0.8 10*3/uL — ABNORMAL LOW (ref 0.9–3.3)

## 2011-07-05 NOTE — Telephone Encounter (Signed)
I called pt per Dr. Arline Asp to see if Dr.Butler had follow up on her right kidney cystic structure. The radiologist had recommended an ultrasound in 6 months to reevaluate. She states he did not mention the follow up but she would like for Dr. Arline Asp to set this up for her.

## 2011-07-05 NOTE — Progress Notes (Signed)
CC:   Ana Wilson, M.D. Oneita Hurt, M.D. Gilford Rile Benedetto Goad, M.D. Hal Morales, M.D. Webb Silversmith, MD  PROBLEM LIST: 1. Multifocal carcinoma of the left breast with the largest tumor     measuring 1.2 cm dating back to September 2002.  The patient     underwent partial mastectomy and sentinel lymph node biopsy on     12/06/2000.  She had 3 tumors in the breast.  Sentinel lymph nodes     were negative.  Tumor stage was T1c N0 M0.  There was a close     posterior margin.  All other margins were widely negative.  Hormone     receptors were strongly positive.  HER-2/neu was FISH negative.     She received 6 cycles of chemotherapy with CMF from 01/06/2001     through March 2003.  She received radiation treatments to the left     breast total of 6640 cGy from 06/26/2001 through 08/15/2001.  She     then received adjuvant tamoxifen from late March 2003 through     January 2008.  She has remained disease free since that time. 2. Irritable bowel syndrome. 3. GERD. 4. History of lung nodules dating back at least to the spring of 2012. 5. Status post laparoscopic cholecystectomy for cholelithiasis on     11/30/2010. 6. Osteoporosis noted in October 2009. 7. Status post vaginal hysterectomy in the mid to late 1990s. 8. Leukopenia dating back to the fall of 2002.  MEDICATION: 1. Os-Cal with vitamin D 500-200, 1 tab daily. 2. Zyrtec 10 mg daily. 3. Bentyl 20 mg every 6 hours as needed for abdominal discomfort. 4. Multivitamins. 5. Aleve 220 mg twice daily as needed. 6. Prilosec 20 mg daily. 7. MiraLAX 17 g daily.  HISTORY:  Ana Wilson was seen today for followup of her multifocal carcinoma of the left breast dating back to September 2002.  The patient remains disease free.  Her main problem appears to be postprandial abdominal pain, currently being evaluated by Dr. Webb Silversmith in [place].  Because the patient had gallstones and was having abdominal symptoms, she  underwent laparoscopic cholecystectomy on 11/30/2010. Unfortunately her symptoms remain.  She says when she eats she has cramping nagging type abdominal pain that comes on almost immediately, lasts for about an hour.  She takes Bentyl for this which may be helpful.  She also has noted some body odor when she eats.  She has constipation and apparently has a diagnosis of irritable bowel syndrome. There are no symptoms to suggest recurrent breast cancer.  The patient was being followed for some small pulmonary nodules that go back, I believe, to the spring of 2012.  The patient has had followup scans most recently on 05/11/2011 that show no change.  The patient has no respiratory symptoms.  No sense of ill health.  PHYSICAL EXAM:  She looks well.  Weight tends to fluctuate.  Today her weight is 152 pounds as compared with 140 pounds on her last visit here on January 04, 2011.  Height 5 feet even.  Body surface area 1.71 sq/m. Blood pressure 125/71.  Other vital signs are normal.  There is no scleral icterus.  Mouth and pharynx are benign.  The patient has an upper plate.  There is no peripheral adenopathy palpable.  Heart and lungs are normal.  No axillary adenopathy.  Breasts are fairly symmetrical.  The left breast is slightly shorter than the right breast. Cosmetic result is excellent.  Both breasts are soft.  The patient's scar is located in the upper inner quadrant of the left breast.  No suspicious findings.  Abdomen is benign with no organomegaly masses palpable.  Extremities:  No peripheral edema or clubbing.  No obvious lymphedema of the left arm.  Neurologic exam is nonfocal.  LABORATORY DATA:  Today, white count 2.9, ANC 1.6, hemoglobin 12.6, hematocrit 38.6, platelets 244,000.  The patient has mild leukopenia that has been fairly consistent for several years with no progression. Chemistries today are pending.  Chemistries from 01/04/2011 were normal except for glucose of  66.  IMAGING STUDIES: 1. Chest x-ray, 2 view, from 06/29/2010 showed a 13 mm left upper lobe     opacity versus nodule, CT scan was suggested. 2. CT scan of the chest with IV contrast on 07/02/2010 showed a 5 mm     left upper lobe lung nodule and a 4 mm right lower lung nodule with     further follow up recommended.  There was a focal wedge-shaped area     of infiltration or atelectasis seen in the left lung which might     account for the chest x-ray finding. 3. CT scan of the chest with IV contrast on 10/01/2010 showed     stability compared with the prior study of 07/02/2010.  A repeat CT     scan in 8 months was suggested. 4. Abdominal ultrasound on 11/09/2010 showed cholelithiasis without     evidence for acute cholecystitis.  There was a 2.1 cm cystic     structure with layering calcifications associated with the right     kidney.  Sonographic follow up was suggested in 6 months. 5. Digital screening mammogram bilateral carried out on 01/04/2011 was     negative. 6. CT scan of the chest without IV contrast on 05/11/2011 showed     multiple small bilateral pulmonary nodules unchanged since the     prior exam of 10/01/2010.  There was stable wedge shaped area of     the peripheral density in the left lobe, most likely secondary to     radiation.  IMPRESSION AND PLAN:  Ana Wilson is now out 12-1/2 years from the time of diagnosis of her cancer of the left breast.  She is on no therapy.  The patient was reassured about her breast cancer.  Also about the pulmonary nodules which appear to be stable.  In looking through the patient's chart, I see where there was a lesion seen in the right kidney.  Followup exam was suggested.  We will call the patient and see if she has had followup on this study.  Apparently it was ordered by Dr. Webb Silversmith.  If there has not been followup, we will be happy to arrange that for her.  Mrs. Ohalloran was given the option of having follow up  by her primary physician or coming back to see [place] in 1 year.  We also talked about reasonable followup regarding the pulmonary nodules.  I think it is reasonable to get another chest x-ray in about a year.  Mrs. Amador would like to see [place] also in 1 year.  That appointment has been made. Will check CBC and chemistries, also a chest x-ray.    ______________________________ Samul Dada, M.D. DSM/MEDQ  D:  07/05/2011  T:  07/05/2011  Job:  [id number]

## 2011-07-05 NOTE — Progress Notes (Signed)
This office note has been dictated.  #[id number]

## 2011-07-07 ENCOUNTER — Other Ambulatory Visit: Payer: Self-pay | Admitting: Medical Oncology

## 2011-07-07 ENCOUNTER — Telehealth: Payer: Self-pay | Admitting: Oncology

## 2011-07-07 DIAGNOSIS — N281 Cyst of kidney, acquired: Secondary | ICD-10-CM

## 2011-07-07 NOTE — Telephone Encounter (Signed)
/  m with 4/26 u/s appt and 07/06/12 appt   aom

## 2011-07-08 ENCOUNTER — Telehealth: Payer: Self-pay | Admitting: Internal Medicine

## 2011-07-08 ENCOUNTER — Telehealth: Payer: Self-pay | Admitting: Oncology

## 2011-07-08 NOTE — Telephone Encounter (Signed)
l/m that u/s had been changed to to gi from [facility] per pt,l./m   with appt info   aom

## 2011-07-08 NOTE — Telephone Encounter (Signed)
pt called,re: [place]  and u/s,advised that [place] will cover u/s just not ct/mri,l/m to c/b   aom

## 2011-07-09 ENCOUNTER — Other Ambulatory Visit (HOSPITAL_COMMUNITY): Payer: Managed Care, Other (non HMO)

## 2011-07-12 ENCOUNTER — Ambulatory Visit
Admission: RE | Admit: 2011-07-12 | Discharge: 2011-07-12 | Disposition: A | Discharge status: Home / Self Care | Payer: Managed Care, Other (non HMO) | Source: Ambulatory Visit | Attending: Oncology | Admitting: Oncology

## 2011-07-12 DIAGNOSIS — N281 Cyst of kidney, acquired: Secondary | ICD-10-CM

## 2011-07-12 NOTE — Progress Notes (Signed)
Quick Note:  Please notify patient and call/fax these results to patient's doctors. ______ 

## 2011-07-13 ENCOUNTER — Telehealth: Payer: Self-pay | Admitting: Medical Oncology

## 2011-07-13 NOTE — Telephone Encounter (Signed)
I called pt with results of her renal ultrasound per Dr. Arline Asp.

## 2011-11-09 ENCOUNTER — Other Ambulatory Visit: Payer: Self-pay | Admitting: Family Medicine

## 2011-11-10 ENCOUNTER — Encounter: Payer: Self-pay | Admitting: Internal Medicine

## 2011-11-11 ENCOUNTER — Ambulatory Visit
Admission: RE | Admit: 2011-11-11 | Discharge: 2011-11-11 | Disposition: A | Discharge status: Home / Self Care | Payer: Managed Care, Other (non HMO) | Source: Ambulatory Visit | Attending: Family Medicine | Admitting: Family Medicine

## 2011-11-11 ENCOUNTER — Other Ambulatory Visit: Payer: Self-pay | Admitting: Family Medicine

## 2011-11-11 DIAGNOSIS — M545 Low back pain: Secondary | ICD-10-CM

## 2011-11-11 DIAGNOSIS — Z8739 Personal history of other diseases of the musculoskeletal system and connective tissue: Secondary | ICD-10-CM

## 2011-11-11 DIAGNOSIS — M79609 Pain in unspecified limb: Secondary | ICD-10-CM

## 2011-11-11 DIAGNOSIS — Z853 Personal history of malignant neoplasm of breast: Secondary | ICD-10-CM

## 2011-11-24 ENCOUNTER — Other Ambulatory Visit: Payer: Self-pay | Admitting: Family Medicine

## 2011-11-24 DIAGNOSIS — M810 Age-related osteoporosis without current pathological fracture: Secondary | ICD-10-CM

## 2011-11-25 ENCOUNTER — Other Ambulatory Visit: Payer: Self-pay | Admitting: Family Medicine

## 2011-11-25 DIAGNOSIS — Z1231 Encounter for screening mammogram for malignant neoplasm of breast: Secondary | ICD-10-CM

## 2011-12-14 ENCOUNTER — Other Ambulatory Visit: Payer: Managed Care, Other (non HMO)

## 2011-12-14 ENCOUNTER — Encounter: Payer: Self-pay | Admitting: Internal Medicine

## 2011-12-14 ENCOUNTER — Ambulatory Visit (INDEPENDENT_AMBULATORY_CARE_PROVIDER_SITE_OTHER): Payer: Managed Care, Other (non HMO) | Admitting: Internal Medicine

## 2011-12-14 VITALS — BP 110/72 | HR 68 | Ht 60.0 in | Wt 152.2 lb

## 2011-12-14 DIAGNOSIS — R1013 Epigastric pain: Secondary | ICD-10-CM

## 2011-12-14 DIAGNOSIS — K219 Gastro-esophageal reflux disease without esophagitis: Secondary | ICD-10-CM

## 2011-12-14 DIAGNOSIS — K5900 Constipation, unspecified: Secondary | ICD-10-CM

## 2011-12-14 DIAGNOSIS — K589 Irritable bowel syndrome without diarrhea: Secondary | ICD-10-CM

## 2011-12-14 MED ORDER — OMEPRAZOLE 20 MG PO CPDR: 20.0000 mg | DELAYED_RELEASE_CAPSULE | Freq: Every day | ORAL | Status: DC

## 2011-12-14 MED ORDER — POLYETHYLENE GLYCOL 3350 17 GM/SCOOP PO POWD: 17.0000 g | Freq: Every day | ORAL | Status: DC

## 2011-12-14 NOTE — Patient Instructions (Addendum)
  Your physician has requested that you go to the basement for lab work before leaving today  You have been scheduled for an endoscopy with propofol. Please follow the written instructions given to you at your visit today. Please pick up your prep at the pharmacy within the next 1-3 days. If you use inhalers (even only as needed), please bring them with you on the day of your procedure.    You have been scheduled for an abdominal ultrasound on 12-17-11 at 8:00am.  Please arrive 15 minutes early for the test and do not eat or drink anything after midnight prior to the test.  The location is [address], [place].  We have sent the following medications to your pharmacy for you to pick up at your convenience:  Glycolax, Omeprazole

## 2011-12-15 ENCOUNTER — Encounter: Payer: Self-pay | Admitting: Internal Medicine

## 2011-12-15 NOTE — Progress Notes (Signed)
HISTORY OF PRESENT ILLNESS:  Ana Wilson is a 56 y.o. female with a history of breast cancer, GERD, IBS, and prior cholecystectomy. She presents herself for evaluation of GI complaints and body odor. She has been evaluated by different gastroenterologists elsewhere. Previous colonoscopy in 1996, 2006, and 2011 without neoplasia or other abnormalities. Her chief complaint today is that of an abnormal body odor. She cannot describe the odor, but believes that is secondary to things that she that it "comes out through the skin". She claims that this is embarrassing. She has talked to other physicians regarding this, but no resolution. Other complaints include alternating bowel habits and many years with a tendency toward constipation. Constipation is significantly she takes fiber or GlycoLax, which help him. Also gastroesophageal reflux disease manifested by regurgitation and pyrosis for which she takes omeprazole with good control. No dysphagia. She also mentions bloating and nausea. She has had steady weight gain. She is status post cholecystectomy last year. Review of her most recent colonoscopy report from Dr. Charm Barges and [place] from 12/10/2009 shows a normal colon. A polypoid lesion defined pathologically as a benign lymphoid aggregate was removed. Routine followup in 10 years recommended. Finally, patient reports several episodes of postprandial pain in the back it radiates bilaterally underneath the rib cage. No associated fever, jaundice, or urinary changes.  REVIEW OF SYSTEMS:  All non-GI ROS negative except for allergies and insomnia. Also, in ability to taste  Past Medical History  Diagnosis Date  . 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 Malignant neoplasm of upper-inner quadrant of female breast   . GERD (gastroesophageal reflux disease)   . Osteoporosis   . Arthritis     Past Surgical History  Procedure Date  . Mastectomy  12/06/2000    followed by 6 cycles of CMF 01/06/01- 05/15/01. Radiation therapy to Lt breast 4/14/th-08/15/2001  . Breast lumpectomy   . Vesicovaginal fistula closure w/ tah   . Cholecystectomy   . Partial hysterectomy     Social History Ana Wilson  reports that she quit smoking about 13 years ago. Her smoking use included Cigarettes. She has never used smokeless tobacco. She reports that she does not drink alcohol or use illicit drugs.  family history includes Diabetes type II in her maternal grandmother and Lung cancer in her father.  Allergies  Allergen Reactions  . Penicillins Hives  . Sulfa Antibiotics Rash       PHYSICAL EXAMINATION: Vital signs: BP 110/72  Pulse 68  Ht 5' (1.524 m)  Wt 152 lb 3.2 oz (69.037 kg)  BMI 29.72 kg/m2  Constitutional: generally well-appearing, no acute distress. No obvious odor detected Psychiatric: alert and oriented x3, cooperative Eyes: extraocular movements intact, anicteric, conjunctiva pink Mouth: oral pharynx moist, no lesions Neck: supple no lymphadenopathy Cardiovascular: heart regular rate and rhythm, no murmur Lungs: clear to auscultation bilaterally Abdomen: soft, nontender, nondistended, no obvious ascites, no peritoneal signs, normal bowel sounds, no organomegaly Rectal:deferred Extremities: no lower extremity edema bilaterally Skin: no lesions on visible extremities Neuro: No focal deficits.   ASSESSMENT:  #1. Constipation predominant irritable bowel syndrome #2. Recurrent postprandial back and upper abdominal pain of uncertain cause. The patient is status post cholecystectomy #3. GERD. Symptoms seemed to be controlled with PPI #4. Multiple prior colonoscopies all unremarkable, most recently September 2011, elsewhere. #5. History of breast cancer #6. Subjective complaints of odor. Referred back to primary care physician  PLAN:  #1. Abdominal ultrasound #2. Liver function tests #3. Diagnostic upper  endoscopy #4. Continue reflux precautions and PPI #5. Continue fiber and GlycoLax for constipation #6. Repeat routine screening colonoscopy in 2021 #7. If the above investigations negative, consider trial of Librax for postprandial pain

## 2011-12-17 ENCOUNTER — Ambulatory Visit
Admission: RE | Admit: 2011-12-17 | Discharge: 2011-12-17 | Disposition: A | Discharge status: Home / Self Care | Payer: Managed Care, Other (non HMO) | Source: Ambulatory Visit | Attending: Internal Medicine | Admitting: Internal Medicine

## 2011-12-17 DIAGNOSIS — K589 Irritable bowel syndrome without diarrhea: Secondary | ICD-10-CM

## 2011-12-17 DIAGNOSIS — K219 Gastro-esophageal reflux disease without esophagitis: Secondary | ICD-10-CM

## 2011-12-17 DIAGNOSIS — K5900 Constipation, unspecified: Secondary | ICD-10-CM

## 2011-12-17 DIAGNOSIS — R1013 Epigastric pain: Secondary | ICD-10-CM

## 2011-12-20 ENCOUNTER — Encounter: Payer: Self-pay | Admitting: Internal Medicine

## 2011-12-20 NOTE — Telephone Encounter (Signed)
error 

## 2011-12-28 ENCOUNTER — Telehealth: Payer: Self-pay | Admitting: Internal Medicine

## 2011-12-28 ENCOUNTER — Telehealth: Payer: Self-pay

## 2011-12-28 NOTE — Telephone Encounter (Signed)
Pt is taking decongestant; no fever.  Plans to come for EGD tomorrow.

## 2011-12-29 ENCOUNTER — Encounter: Payer: Self-pay | Admitting: Internal Medicine

## 2011-12-29 ENCOUNTER — Ambulatory Visit (AMBULATORY_SURGERY_CENTER): Payer: Managed Care, Other (non HMO) | Admitting: Internal Medicine

## 2011-12-29 VITALS — BP 126/72 | HR 65 | Temp 97.2°F | Resp 21 | Ht 60.0 in | Wt 152.0 lb

## 2011-12-29 DIAGNOSIS — K5900 Constipation, unspecified: Secondary | ICD-10-CM

## 2011-12-29 DIAGNOSIS — R1013 Epigastric pain: Secondary | ICD-10-CM

## 2011-12-29 DIAGNOSIS — K219 Gastro-esophageal reflux disease without esophagitis: Secondary | ICD-10-CM

## 2011-12-29 MED ORDER — CILIDINIUM-CHLORDIAZEPOXIDE 2.5-5 MG PO CAPS: 1.0000 | ORAL_CAPSULE | Freq: Three times a day (TID) | ORAL | Status: DC | PRN

## 2011-12-29 MED ORDER — SODIUM CHLORIDE 0.9 % IV SOLN: 500.0000 mL | INTRAVENOUS | Status: DC

## 2011-12-29 NOTE — Op Note (Signed)
[facility] [address] [place], [address]   ENDOSCOPY PROCEDURE REPORT  PATIENT: Ana Wilson, Ana Wilson  MR#: [id number] BIRTHDATE: 1955-05-06 , 56  yrs. old GENDER: Female ENDOSCOPIST: Roxy Cedar, MD REFERRED BY:  Office Self, M.D. PROCEDURE DATE:  12/29/2011 PROCEDURE:  EGD, diagnostic ASA CLASS:     Class II INDICATIONS:  epigastric pain. MEDICATIONS: MAC sedation, administered by CRNA and propofol (Diprivan) 240mg  IV TOPICAL ANESTHETIC: Cetacaine Spray  DESCRIPTION OF PROCEDURE: After the risks benefits and alternatives of the procedure were thoroughly explained, informed consent was obtained.  The LB-GIF Q180 [id number] endoscope was introduced through the mouth and advanced to the second portion of the duodenum. Without limitations.  The instrument was slowly withdrawn as the mucosa was fully examined.    The upper, middle and distal third of the esophagus were carefully inspected and no abnormalities were noted.  The z-line was well seen at the GEJ.  The endoscope was pushed into the fundus which was normal including a retroflexed view.  The antrum, gastric body, first and second part of the duodenum were unremarkable. Retroflexed views revealed no abnormalities.     The scope was then withdrawn from the patient and the procedure completed.  COMPLICATIONS: There were no complications.  ENDOSCOPIC IMPRESSION: 1. Normal EGD  RECOMMENDATIONS: 1. continue omeprazole once daily 2. Prescrible Librax (generic); #100; one PO ac prn; 2 refils 3. Office follow up in 6-8 weeks  REPEAT EXAM:  eSigned:  Roxy Cedar, MD 12/29/2011 4:07 PM   CC:The Patient and Irena Reichmann MD  ([place])

## 2011-12-29 NOTE — Patient Instructions (Signed)
CONTINUE YOUR OMEPRAZOLE ONCE DAILY.  LIBRAX PRESCRIPTION SENT TO YOUR PHARMACY.  CALL DR, PERRY'S OFFICE FOR AN APPOINTMENT IN 6-8 WEEKS, [phone].  YOU HAD AN ENDOSCOPIC PROCEDURE TODAY AT THE [facility]: Refer to the procedure report that was given to you for any specific questions about what was found during the examination.  If the procedure report does not answer your questions, please call your gastroenterologist to clarify.  If you requested that your care partner not be given the details of your procedure findings, then the procedure report has been included in a sealed envelope for you to review at your convenience later.  YOU SHOULD EXPECT: Some feelings of bloating in the abdomen. Passage of more gas than usual.  Walking can help get rid of the air that was put into your GI tract during the procedure and reduce the bloating. If you had a lower endoscopy (such as a colonoscopy or flexible sigmoidoscopy) you may notice spotting of blood in your stool or on the toilet paper. If you underwent a bowel prep for your procedure, then you may not have a normal bowel movement for a few days.  DIET: Your first meal following the procedure should be a light meal and then it is ok to progress to your normal diet.  A half-sandwich or bowl of soup is an example of a good first meal.  Heavy or fried foods are harder to digest and may make you feel nauseous or bloated.  Likewise meals heavy in dairy and vegetables can cause extra gas to form and this can also increase the bloating.  Drink plenty of fluids but you should avoid alcoholic beverages for 24 hours.  ACTIVITY: Your care partner should take you home directly after the procedure.  You should plan to take it easy, moving slowly for the rest of the day.  You can resume normal activity the day after the procedure however you should NOT DRIVE or use heavy machinery for 24 hours (because of the sedation medicines used during the test).      SYMPTOMS TO REPORT IMMEDIATELY: A gastroenterologist can be reached at any hour.  During normal business hours, 8:30 AM to 5:00 PM Monday through Friday, call [phone].  After hours and on weekends, please call the GI answering service at [phone] who will take a message and have the physician on call contact you.   Following upper endoscopy (EGD)  Vomiting of blood or coffee ground material  New chest pain or pain under the shoulder blades  Painful or persistently difficult swallowing  New shortness of breath  Fever of 100F or higher  Black, tarry-looking stools  FOLLOW UP: If any biopsies were taken you will be contacted by phone or by letter within the next 1-3 weeks.  Call your gastroenterologist if you have not heard about the biopsies in 3 weeks.  Our staff will call the home number listed on your records the next business day following your procedure to check on you and address any questions or concerns that you may have at that time regarding the information given to you following your procedure. This is a courtesy call and so if there is no answer at the home number and we have not heard from you through the emergency physician on call, we will assume that you have returned to your regular daily activities without incident.  SIGNATURES/CONFIDENTIALITY: You and/or your care partner have signed paperwork which will be entered into your electronic medical record.  These signatures attest to the fact that that the information above on your After Visit Summary has been reviewed and is understood.  Full responsibility of the confidentiality of this discharge information lies with you and/or your care-partner.  

## 2011-12-29 NOTE — Progress Notes (Signed)
Patient did not experience any of the following events: a burn prior to discharge; a fall within the facility; wrong site/side/patient/procedure/implant event; or a hospital transfer or hospital admission upon discharge from the facility. (G8907) Patient did not have preoperative order for IV antibiotic SSI prophylaxis. (G8918)  

## 2011-12-30 ENCOUNTER — Telehealth: Payer: Self-pay | Admitting: *Deleted

## 2011-12-30 ENCOUNTER — Other Ambulatory Visit: Payer: Self-pay | Admitting: Internal Medicine

## 2011-12-30 NOTE — Telephone Encounter (Signed)
CALLED PT BACK LEFT MESS THAT LIBRAX CALLED IN TO [facility]

## 2011-12-30 NOTE — Telephone Encounter (Signed)
Message answered nurse opened a new encounter.

## 2011-12-30 NOTE — Telephone Encounter (Signed)
The pharmacy has the prescriptions.

## 2012-01-05 ENCOUNTER — Ambulatory Visit
Admission: RE | Admit: 2012-01-05 | Discharge: 2012-01-05 | Disposition: A | Discharge status: Home / Self Care | Payer: Managed Care, Other (non HMO) | Source: Ambulatory Visit | Attending: Family Medicine | Admitting: Family Medicine

## 2012-01-05 ENCOUNTER — Other Ambulatory Visit: Payer: Managed Care, Other (non HMO)

## 2012-01-05 ENCOUNTER — Ambulatory Visit: Payer: Managed Care, Other (non HMO)

## 2012-01-05 DIAGNOSIS — Z1231 Encounter for screening mammogram for malignant neoplasm of breast: Secondary | ICD-10-CM

## 2012-01-05 DIAGNOSIS — M810 Age-related osteoporosis without current pathological fracture: Secondary | ICD-10-CM

## 2012-01-12 ENCOUNTER — Other Ambulatory Visit: Payer: Self-pay | Admitting: Internal Medicine

## 2012-02-18 ENCOUNTER — Encounter: Payer: Self-pay | Admitting: Internal Medicine

## 2012-02-18 ENCOUNTER — Ambulatory Visit: Payer: Managed Care, Other (non HMO) | Admitting: Internal Medicine

## 2012-02-18 ENCOUNTER — Ambulatory Visit (INDEPENDENT_AMBULATORY_CARE_PROVIDER_SITE_OTHER): Payer: Managed Care, Other (non HMO) | Admitting: Internal Medicine

## 2012-02-18 VITALS — BP 134/68 | HR 72 | Ht 60.0 in | Wt 153.0 lb

## 2012-02-18 DIAGNOSIS — K219 Gastro-esophageal reflux disease without esophagitis: Secondary | ICD-10-CM

## 2012-02-18 DIAGNOSIS — R1013 Epigastric pain: Secondary | ICD-10-CM

## 2012-02-18 MED ORDER — CILIDINIUM-CHLORDIAZEPOXIDE 2.5-5 MG PO CAPS: 1.0000 | ORAL_CAPSULE | Freq: Three times a day (TID) | ORAL | Status: DC | PRN

## 2012-02-18 MED ORDER — POLYETHYLENE GLYCOL 3350 17 GM/SCOOP PO POWD: 17.0000 g | Freq: Every day | ORAL | Status: DC

## 2012-02-18 NOTE — Progress Notes (Signed)
HISTORY OF PRESENT ILLNESS:  Ana Wilson is a 56 y.o. female with the below listed medical history who was evaluated 12/14/2011 regarding subjective complaints of foul odor that she felt was secondary to food items, constipation, GERD, and abdominal pain. See that dictation for details. For GERD, she was continued on PPI, which helped. MiraLax recommended for constipation. To evaluate her pain, an abdominal ultrasound was performed 12/17/2011. This revealed fatty infiltration of the liver and nonobstructing calculus was in the right mid kidney as well as prior evidence of cholecystectomy. Otherwise negative. Liver tests were requested but not obtained as the phlebotomist could not find an adequate vein. However, review of the old record reveals liver tests on 3 occasions over the past 18 months. She subsequently underwent upper endoscopy on 12/29/2011. Examination was entirely normal. She was prescribed Librax stroke before meals as needed. She presents today for followup as requested. She continues with good control of classic reflux symptoms on PPI. Postprandial upper abdominal complaints are improved with the use of Librax. As well, constipation has improved with MiraLax. She reports moving her bowels 2-3 times per day. Last colonoscopy, elsewhere, September 2011, was normal. She still has concerns over subjective body other which she feels is related to foods, such as coffee.  REVIEW OF SYSTEMS:  All non-GI ROS negative except for . Frequency, sinus and allergy trouble  Past Medical History  Diagnosis Date  . 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 Malignant neoplasm of upper-inner quadrant of female breast   . GERD (gastroesophageal reflux disease)   . Osteoporosis   . Arthritis     Past Surgical History  Procedure Date  . Breast lumpectomy   . Vesicovaginal fistula closure w/ tah   . Cholecystectomy   . Partial hysterectomy      Social History Ana Wilson  reports that she quit smoking about 13 years ago. Her smoking use included Cigarettes. She has never used smokeless tobacco. She reports that she does not drink alcohol or use illicit drugs.  family history includes Diabetes type II in her maternal grandmother and Lung cancer in her father.  Allergies  Allergen Reactions  . Penicillins Hives  . Sulfa Antibiotics Rash       PHYSICAL EXAMINATION:  Vital signs: BP 134/68  Pulse 72  Ht 5' (1.524 m)  Wt 153 lb (69.4 kg)  BMI 29.88 kg/m2  SpO2 98% General: Well-developed, well-nourished, no acute distress HEENT: Sclerae are anicteric, conjunctiva pink. Oral mucosa intact Lungs: Clear Heart: Regular Abdomen: soft, nontender, nondistended, no obvious ascites, no peritoneal signs, normal bowel sounds. No organomegaly. Extremities: No edema Psychiatric: alert and oriented x3. Cooperative     ASSESSMENT:  #1. GERD. Controlled with PPI #2. Constipation predominant irritable bowel. Improved with MiraLax  #3. Postprandial abdominal discomfort. Likely spasm. Improve with Librax. #4. Normal colonoscopy 2011, elsewhere #5. Subjective complaints of body odor.   PLAN:  #1. Reflux precautions #2. Continue PPI #3. Continue MiraLax as needed #4. Continue Librax as needed #5. Routine screening colonoscopy 2021 #6. Return to primary provider. I told her that I am sure that I could not help her with her subjective body odor concerns

## 2012-02-18 NOTE — Patient Instructions (Addendum)
We have sent the following medications to your pharmacy for you to pick up at your convenience:  Miralax and Librax

## 2012-03-20 ENCOUNTER — Ambulatory Visit: Payer: Managed Care, Other (non HMO) | Admitting: Obstetrics and Gynecology

## 2012-03-27 ENCOUNTER — Telehealth: Payer: Self-pay | Admitting: *Deleted

## 2012-03-27 ENCOUNTER — Encounter: Payer: Self-pay | Admitting: Internal Medicine

## 2012-03-27 NOTE — Telephone Encounter (Signed)
Rene Kocher, tell her that Librax can cause liver test abnormalities, but this is very rare. She does not need this medication. Probably best if she stops it and has her liver tests repeated in 6 weeks. If her tests are still elevated, it may be something else causing the abnormalities. She could be evaluated in the office if she wishes. Thanks ----- Message ----- From: Daphine Deutscher, RN Sent: 03/27/2012 10:15 AM To: Hilarie Fredrickson, MD Subject: FW: Visit Follow-Up Question ----- Message ----- From: Deborah Chalk Sent: 03/27/2012 9:44 AM To: [facility] Subject: Visit Follow-Up Question Dr. Marina Goodell, last week I got my yrly exam. It included a CBC. My ALT-AST numbers are 48 & 43. My numbers in 4/13 was 20 & 34. Could the clidinium cause me to have elevated numbers? If so, I will need to have it changed. Takaya Calvert Spoke with patient and she will stop the Librax and have her labs rechecked in 6 weeks. She will call us if the levels are still up and/or she wants to discuss with Dr. Marina Goodell.

## 2012-04-13 ENCOUNTER — Telehealth: Payer: Self-pay

## 2012-04-13 ENCOUNTER — Encounter: Payer: Self-pay | Admitting: Internal Medicine

## 2012-04-13 NOTE — Telephone Encounter (Signed)
Message copied by Chrystie Nose on Thu Apr 13, 2012 11:03 AM ------      Message from: Hilarie Fredrickson      Created: Thu Apr 13, 2012 10:52 AM      Regarding: RE: Question       Return to the office to discuss      ----- Message -----         From: Lily Lovings, RN         Sent: 04/13/2012  10:33 AM           To: Hilarie Fredrickson, MD      Subject: Question                                                 Dr. Marina Goodell, due to high ALT-AST numbers,  I discontinued the clidinium as advised on 03/27/12 for 6 weeks. For the past 2 weeks I have had bloating, mild stomach discomfort, and very dark green stool. I am drinking 1/2 bottle of peto-bismol each day to  help with the discomfort. It has help a litttle, but is there anything else that I can take over the counter to help me.          Thank you, Shy Rohde                   Dr. Marina Goodell,            This message was sent via "mychart." Please advise.            Thanks,      [employer]

## 2012-04-13 NOTE — Telephone Encounter (Signed)
Left message for pt to call back.  Pt scheduled to see Dr. Marina Goodell 05/08/12@3 :15pm. Pt aware of appt date and time.

## 2012-04-20 ENCOUNTER — Encounter: Payer: Self-pay | Admitting: Obstetrics and Gynecology

## 2012-04-29 ENCOUNTER — Other Ambulatory Visit: Payer: Self-pay

## 2012-05-08 ENCOUNTER — Ambulatory Visit: Payer: Managed Care, Other (non HMO) | Admitting: Internal Medicine

## 2012-06-20 ENCOUNTER — Telehealth: Payer: Self-pay | Admitting: Medical Oncology

## 2012-06-20 ENCOUNTER — Other Ambulatory Visit: Payer: Self-pay | Admitting: Medical Oncology

## 2012-06-20 NOTE — Telephone Encounter (Signed)
Pt called and asked if she needs to get a chest x-ray before her visit with Dr. Arline Asp 07/06/12. I told her yes. She would like to get this at  [facility] if this is ok. I told her yes. I called [facility] and they do see order in the computer and will call if any questions.

## 2012-06-27 ENCOUNTER — Other Ambulatory Visit: Payer: Self-pay | Admitting: Internal Medicine

## 2012-07-06 ENCOUNTER — Other Ambulatory Visit (HOSPITAL_BASED_OUTPATIENT_CLINIC_OR_DEPARTMENT_OTHER): Payer: Managed Care, Other (non HMO) | Admitting: Lab

## 2012-07-06 ENCOUNTER — Ambulatory Visit (HOSPITAL_BASED_OUTPATIENT_CLINIC_OR_DEPARTMENT_OTHER): Payer: Managed Care, Other (non HMO) | Admitting: Physician Assistant

## 2012-07-06 ENCOUNTER — Ambulatory Visit
Admission: RE | Admit: 2012-07-06 | Discharge: 2012-07-06 | Disposition: A | Discharge status: Home / Self Care | Payer: Managed Care, Other (non HMO) | Source: Ambulatory Visit | Attending: Oncology | Admitting: Oncology

## 2012-07-06 VITALS — BP 103/70 | HR 60 | Temp 97.0°F | Resp 20 | Ht 60.0 in | Wt 140.3 lb

## 2012-07-06 DIAGNOSIS — C50912 Malignant neoplasm of unspecified site of left female breast: Secondary | ICD-10-CM

## 2012-07-06 DIAGNOSIS — Z853 Personal history of malignant neoplasm of breast: Secondary | ICD-10-CM

## 2012-07-06 DIAGNOSIS — R918 Other nonspecific abnormal finding of lung field: Secondary | ICD-10-CM

## 2012-07-06 DIAGNOSIS — C50219 Malignant neoplasm of upper-inner quadrant of unspecified female breast: Secondary | ICD-10-CM

## 2012-07-06 DIAGNOSIS — R911 Solitary pulmonary nodule: Secondary | ICD-10-CM

## 2012-07-06 LAB — CBC WITH DIFFERENTIAL/PLATELET
Basophils Absolute: 0 10*3/uL (ref 0.0–0.1)
EOS%: 5.8 % (ref 0.0–7.0)
HCT: 38.1 % (ref 34.8–46.6)
HGB: 12.5 g/dL (ref 11.6–15.9)
LYMPH%: 31.2 % (ref 14.0–49.7)
MCH: 30.6 pg (ref 25.1–34.0)
MCV: 93.2 fL (ref 79.5–101.0)
MONO%: 11 % (ref 0.0–14.0)
NEUT%: 51.1 % (ref 38.4–76.8)

## 2012-07-06 LAB — COMPREHENSIVE METABOLIC PANEL (CC13)
Alkaline Phosphatase: 72 U/L (ref 40–150)
BUN: 14.3 mg/dL (ref 7.0–26.0)
Glucose: 82 mg/dl (ref 70–99)
Total Bilirubin: 0.39 mg/dL (ref 0.20–1.20)

## 2012-07-06 NOTE — Progress Notes (Signed)
[facility]  [phone]    OFFICE PROGRESS NOTE  CC: Ines Bloomer, M.D.  Oneita Hurt, M.D.  Gilford Rile Benedetto Goad, M.D.  Hal Morales, M.D.  Webb Silversmith, MD   PROBLEM LIST:   1. Multifocal carcinoma of the left breast with the largest tumor measuring 1.2 cm dating back to September 2002. The patient underwent partial mastectomy and sentinel lymph node biopsy on 12/06/2000. She had 3 tumors in the breast. Sentinel lymph nodes were negative. Tumor stage was T1c N0 M0. There was a close posterior margin. All other margins were widely negative. Hormone receptors were strongly positive. HER-2/neu was FISH negative. She received 6 cycles of chemotherapy with CMF from 01/06/2001 through March 2003. She received radiation treatments to the left breast total of 6640 cGy from 06/26/2001 through 08/15/2001. She then received adjuvant tamoxifen from late March 2003 through January 2008. She has remained disease free since that time.  2. Irritable bowel syndrome.  3. GERD.  4. History of lung nodules dating back at least to the spring of 2012.  5. Status post laparoscopic cholecystectomy for cholelithiasis on 11/30/2010.  6. Osteoporosis noted in October 2009.  7. Status post vaginal hysterectomy in the mid to late 1990s.  8. Leukopenia dating back to the fall of 2002.      MEDICATIONS:  Current Outpatient Prescriptions  Medication Sig Dispense Refill  . alendronate (FOSAMAX) 70 MG tablet Take 70 mg by mouth every 7 (seven) days. Take with a full glass of water on an empty stomach.       . calcium-vitamin D (OSCAL WITH D) 500-200 MG-UNIT per tablet Take 1 tablet by mouth daily.       . cholecalciferol (VITAMIN D) 400 UNITS TABS Take 400 Units by mouth daily.      . clidinium-chlordiazePOXIDE (LIBRAX) 2.5-5 MG per capsule Take 1 capsule by mouth 3 (three) times daily as needed (TAKE ONE AC PRN).  100 capsule  2  . Diphenhydramine-PE-APAP (ALLERGY  RELIEF PLUS SINUS PO) Take 1 tablet by mouth daily.      . Multiple Vitamin (MULTIVITAMIN) tablet Take 1 tablet by mouth daily.        . Naproxen Sodium (ALEVE) 220 MG CAPS Take 1 capsule by mouth 2 (two) times daily as needed.        Marland Kitchen omeprazole (PRILOSEC) 20 MG capsule Take 1 capsule (20 mg total) by mouth daily.  90 capsule  3  . polyethylene glycol powder (GLYCOLAX/MIRALAX) powder MIX 17GMS (1 CAPFUL) IN 8OZ OF LIQUID ONCE DAILY AND DRINK.  527 g  2   No current facility-administered medications for this visit.    ALLERGIES:   Allergies  Allergen Reactions  . Penicillins Hives  . Sulfa Antibiotics Rash   HISTORY: Ana Wilson was seen today for followup of her multifocal carcinoma of the left breast dating back to September 2002. The patient remains disease free. Her main problem appears to be postprandial abdominal pain, currently being evaluated by Dr. Webb Silversmith in [place].She has undergone several studies, including abdominal ultrasound and endoscopy with negative results.She was placed on Librax without significant improvement. She is to make an appointment with her GI physician to follow up on these issues. There are no symptoms to suggest recurrent breast cancer.her last mammogram in October 23/13 is negative for any new/acute  findings .The patient was being followed for some small pulmonary nodules that go back, to the spring of 2012. The patient has had followup  scans most recently on 05/11/2011 that show no change.her new chest x-ray today 420 06/2011 is essentially negative. The nodules visualized per CT, are absent. The patient has no respiratory symptoms. No sense of ill health.she remains active, and is on a diet combined with exercise. Her goal is to get to 135 pounds. She continues to work at the at  [employer].   PHYSICAL EXAMINATION:  Filed Vitals:   07/06/12 1050  BP: 103/70  Pulse: 60  Temp: 97 F (36.1 C)  Resp: 20   Filed Weights    07/06/12 1050  Weight: 140 lb 4.8 oz (63.64 kg)    She looks well.  There is no scleral icterus. Mouth and pharynx are benign. The patient has an upper plate. There is no peripheral adenopathy palpable. Heart and lungs are normal. No axillary adenopathy. Breasts are fairly symmetrical. The left breast is slightly shorter than the right breast. Cosmetic result is excellent. Both breasts are soft. The patient's scar is located in the upper inner quadrant of the left breast. No suspicious findings. Abdomen is benign with no organomegaly masses palpable. Extremities: No peripheral edema or clubbing. No obvious lymphedema of the left arm. Neurologic exam is nonfocal.  LABORATORY/RADIOLOGY DATA:   Recent Labs Lab 07/06/12 1027  WBC 3.2*  HGB 12.5  HCT 38.1  PLT 258  MCV 93.2  MCH 30.6  MCHC 32.8  RDW 14.1  LYMPHSABS 1.0  MONOABS 0.4  EOSABS 0.2  BASOSABS 0.0    CMP    Recent Labs Lab 07/06/12 1027  NA 143  K 5.2*  CL 105  CO2 30*  GLUCOSE 82  BUN 14.3  CREATININE 0.9  CALCIUM 9.7  AST 35*  ALT 20  ALKPHOS 72  BILITOT 0.39    On 07/05/2011 white count 2.9, ANC 1.6, hemoglobin 12.6, hematocrit 38.6, platelets 244,000. The patient had mild leukopenia that has been fairly consistent for several years with no progression. Chemistries today are pending. Chemistries from 07/05/2011 and 01/04/2011 were normal       Radiology Studies:  1. Chest x-ray, 2 view, from 06/29/2010 showed a 13 mm left upper lobe opacity versus nodule, CT scan was suggested.  2. CT scan of the chest with IV contrast on 07/02/2010 showed a 5 mm left upper lobe lung nodule and a 4 mm right lower lung nodule with further follow up recommended. There was a focal wedge-shaped area of infiltration or atelectasis seen in the left lung which might account for the chest x-ray finding.  3. CT scan of the chest with IV contrast on 10/01/2010 showed stability compared with the prior study of 07/02/2010. A repeat  CT scan in 8 months was suggested.  4. Abdominal ultrasound on 11/09/2010 showed cholelithiasis without evidence for acute cholecystitis. There was a 2.1 cm cystic structure with layering calcifications associated with the right kidney. Sonographic follow up was suggested in 6 months.  5. Digital screening mammogram bilateral carried out on 01/04/2011 was negative.  6. CT scan of the chest without IV contrast on 05/11/2011 showed multiple small bilateral pulmonary nodules unchanged since the prior exam of 10/01/2010. There was stable wedge shaped area of the peripheral density in the left lobe, most likely secondary to radiation. 7.Renal ultrasound on 07/12/2011 showed suspected 1.8 cm calculus within a calyceal diverticulum in the interpolar right kidney, grossly unchanged.No hydronephrosis. 8.Bone density scan on 01/05/2012 showed P LUMBAR SPINE (L2 - L4) Bone Mineral Density (BMD): 0.786 g/cm2 Young Adult T Score: -  2.7 Z Score: -2.3 ; LEFT FEMUR (NECK) Bone Mineral Density (BMD): 0.601 g/cm2 Young Adult T Score: -2.2 Z Score: -1.6 ASSESSMENT: Patient's diagnostic category is OSTEOPOROSIS by WHO  Criteria.   9. Ultrasound of the abdomen on 12/17/2011 showed Echogenic liver consistent with fatty infiltration. No focal abnormality,  Nonobstructing calculus or calculi within the mid right kidney.  10. Upper endoscopy on 12/29/11 by Dr.Perry, with normal results 12  Digital bilateral screening mammogram on 01/05/2012 showed no mammographic evidence of malignancy 13.Chest x-ray on 07/06/2012  patchy density left upper lobe is stable. No mass or lung nodule. Small lung nodules seen on the prior CT are not visible on chest x-ray. Negative for heart failure or effusion. Negative for pneumonia.   ASSESSMENT AND PLAN:   Ana Wilson is now out 12 years from the time of diagnosis of her cancer of the left breast. She is on no therapy. The patient was reassured about her breast cancer. Also about the  pulmonary nodules which appear to be stable. Her new chest x ray does not show the priorly seen lung nodules.   No follow up visit will be scheduled.the copy of today's lab has been given to her per patient's request.  We will be glad to see her again if the need for oncological or hematological care is present. She agreed with the plan. It has been a pleasure to care for Ana Wilson.   [facility] E, PA-C 07/06/2012, 11:48 AM

## 2012-07-06 NOTE — Patient Instructions (Signed)
Follow up in one year with labs

## 2012-07-07 ENCOUNTER — Telehealth: Payer: Self-pay | Admitting: Oncology

## 2012-07-24 ENCOUNTER — Ambulatory Visit (INDEPENDENT_AMBULATORY_CARE_PROVIDER_SITE_OTHER): Payer: Managed Care, Other (non HMO) | Admitting: Internal Medicine

## 2012-07-24 ENCOUNTER — Encounter: Payer: Self-pay | Admitting: Internal Medicine

## 2012-07-24 VITALS — BP 110/70 | HR 72 | Ht 59.0 in | Wt 142.0 lb

## 2012-07-24 DIAGNOSIS — K589 Irritable bowel syndrome without diarrhea: Secondary | ICD-10-CM

## 2012-07-24 DIAGNOSIS — K219 Gastro-esophageal reflux disease without esophagitis: Secondary | ICD-10-CM

## 2012-07-24 NOTE — Progress Notes (Signed)
HISTORY OF PRESENT ILLNESS:  Ana Wilson is a 57 y.o. female with the below listed medical history who was initially evaluated 12/14/2011 regarding subjective complaints of foul odor that she felt secondary to food items, constipation, GERD, and abdominal pain. See that dictation for details. She was subsequently seen in followup 02/18/2012. The impression at that time was GERD controlled with PPI, constipation predominant IBS improved with MiraLax, postprandial abdominal discomfort, likely spasm, improved with Librax, and normal colonoscopy 2011 (elsewhere). Patient presents today regarding abnormal liver test. She saw her gynecologist in January. She brings blood work with her. Her transaminases were a few points above normal. Not overwhelmingly different than they have been over the past several years. It was suggested that Librax may have caused this. She stop Librax. Followup liver tests in April were normal. The patient denies any personal or family history of liver disease. Terms of reflux, symptoms to controlled with PPI. She reports alternating bowel habits. She's frustrated with her IBS. No new interval problems. Normal upper endoscopy October 2013.  REVIEW OF SYSTEMS:  All non-GI ROS negative except for allergies, night sweats, arthritis  Past Medical History  Diagnosis Date  . 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 Malignant neoplasm of upper-inner quadrant of female breast   . GERD (gastroesophageal reflux disease)   . Osteoporosis   . Arthritis     Past Surgical History  Procedure Laterality Date  . Breast lumpectomy    . Vesicovaginal fistula closure w/ tah    . Cholecystectomy    . Partial hysterectomy      Social History Ana Wilson  reports that she quit smoking about 14 years ago. Her smoking use included Cigarettes. She smoked 0.00 packs per day. She has never used smokeless tobacco. She reports that  she does not drink alcohol or use illicit drugs.  family history includes Diabetes type II in her maternal grandmother and Lung cancer in her father.  Allergies  Allergen Reactions  . Penicillins Hives  . Sulfa Antibiotics Rash       PHYSICAL EXAMINATION: Vital signs: BP 110/70  Pulse 72  Ht 4\' 11"  (1.499 m)  Wt 142 lb (64.411 kg)  BMI 28.67 kg/m2 General: Well-developed, well-nourished, no acute distress HEENT: Sclerae are anicteric, conjunctiva pink. Oral mucosa intact Lungs: Clear Heart: Regular Abdomen: soft, nontender, nondistended, no obvious ascites, no peritoneal signs, normal bowel sounds. No organomegaly. Extremities: No edema Psychiatric: alert and oriented x3. Cooperative     ASSESSMENT:  #1. IBS. Ongoing #2. GERD. Controlled with PPI #3. Transient mild elevation of liver tests. Resolved. No further workup needed. If problem recurs and persists, reevaluate.   PLAN:  #1. Discussion today on IBS. Recommended adding fiber. She states this has not helped in the past. #2. Could consider alternative antispasmodic Librax #3. Continue reflux precautions and PPI #4. Routine surveillance colonoscopy 2021. #5. GI followup as needed

## 2012-07-24 NOTE — Patient Instructions (Addendum)
You have been given some information on IBS.  Please follow up with Dr. Marina Goodell as needed.

## 2012-07-27 ENCOUNTER — Telehealth: Payer: Self-pay | Admitting: Internal Medicine

## 2012-07-27 NOTE — Telephone Encounter (Signed)
Left message for pt to call back  °

## 2012-08-01 MED ORDER — BELLADONNA ALK-PHENOBARBITAL 48 MG PO TBCR: 1.0000 | EXTENDED_RELEASE_TABLET | Freq: Four times a day (QID) | ORAL | Status: DC | PRN

## 2012-08-01 MED ORDER — BELLADONNA ALK-PHENOBARBITAL 16.2 MG PO TABS: 1.0000 | ORAL_TABLET | ORAL | Status: DC | PRN

## 2012-08-01 NOTE — Addendum Note (Signed)
Addended by: Selinda Michaels R on: 08/01/2012 03:28 PM   Modules accepted: Orders, Medications

## 2012-08-01 NOTE — Telephone Encounter (Signed)
Pt states she tried taking bentyl but states it did not help her at all. Pt was given Librax to try but she states that it makes her so sleepy she can't take it. Pt wants to know if Dr. Marina Goodell has something else he could recommend for her to take. Please advise.

## 2012-08-01 NOTE — Telephone Encounter (Signed)
Try Donnatal. Please prescribed the short acting. Thanks

## 2012-08-01 NOTE — Telephone Encounter (Signed)
Pt aware and script sent to pharmacy. 

## 2012-08-02 ENCOUNTER — Telehealth: Payer: Self-pay | Admitting: Internal Medicine

## 2012-08-02 MED ORDER — BELLADONNA ALK-PHENOBARBITAL 16.2 MG PO TABS: 1.0000 | ORAL_TABLET | ORAL | Status: DC | PRN

## 2012-08-02 NOTE — Telephone Encounter (Signed)
Script resent to the pharmacy.

## 2012-08-04 ENCOUNTER — Telehealth: Payer: Self-pay

## 2012-08-04 NOTE — Telephone Encounter (Signed)
Pharmacy called to say the Donnatal was not available in tablets, could only be found in a liquid.  Dr. Marina Goodell and Bonita Quin were both out of the office so pharmacist said she would tell patient she would communicate with [place] on Tuesday when we return to the office and then talk to the patient about what the decision about the med was.

## 2012-08-08 NOTE — Telephone Encounter (Signed)
Per pharmacy only the Donnatal liquid is available and it is very expensive. Pharmacist is going to look into cheaper alternative and call us back.

## 2012-08-09 NOTE — Telephone Encounter (Signed)
Spoke with pharmacist and she is going to try and contact the company and see if perhaps they may compound something that may be similar to donnatal. Pharmacy will call back.

## 2012-08-10 ENCOUNTER — Telehealth: Payer: Self-pay | Admitting: Internal Medicine

## 2012-08-11 ENCOUNTER — Telehealth: Payer: Self-pay | Admitting: Internal Medicine

## 2012-08-11 MED ORDER — HYOSCYAMINE SULFATE 0.125 MG SL SUBL: 0.1250 mg | SUBLINGUAL_TABLET | SUBLINGUAL | Status: DC | PRN

## 2012-08-11 NOTE — Telephone Encounter (Signed)
Sent to Selinda Michaels who has been dealing with this patient and pharmacy

## 2012-08-11 NOTE — Telephone Encounter (Signed)
Try Levsin sublingual 0.125 mg. Dispense 60... 1 to 2 sublingual Q4 hours as needed for abdominal discomfort. 3 refills

## 2012-08-11 NOTE — Telephone Encounter (Signed)
Pt states the donnatol is very expensive and she cannot get a generic. Pt spoke with her pharmacist and he suggested she ask her physician about possibly trying hyoscyamine. Dr. Marina Goodell please advise if pt may try this.

## 2012-08-11 NOTE — Addendum Note (Signed)
Addended by: Selinda Michaels R on: 08/11/2012 11:52 AM   Modules accepted: Orders

## 2012-08-11 NOTE — Telephone Encounter (Signed)
Spoke with pharmacy and they have found where they can order the donnatal pills. They are going to order them for the pt.

## 2012-08-15 NOTE — Telephone Encounter (Signed)
rx sent by [facility]MA 

## 2012-09-04 ENCOUNTER — Telehealth: Payer: Self-pay | Admitting: Internal Medicine

## 2012-09-04 NOTE — Telephone Encounter (Signed)
Nothing to offer. I've been over this with her. Sorry

## 2012-09-04 NOTE — Telephone Encounter (Signed)
Left message for pt to call back  °

## 2012-09-04 NOTE — Telephone Encounter (Signed)
Pt states that she is taking Levsin and it has helped with the cramping/bloating that she has been having. Pt feels she is having other digestive problems and feels she has an "odor." Pt states she was sitting beside someone this weekend and the person sitting beside her wanted to know where the sewer smell was coming from. Pt wants to know if there is something else Dr. Marina Goodell could suggest that may help with the "digestive problems/odor." Please advise.

## 2012-09-05 NOTE — Telephone Encounter (Signed)
Left message for pt to call back.  Spoke with pt and she is aware. 

## 2012-10-06 ENCOUNTER — Other Ambulatory Visit: Payer: Self-pay | Admitting: Internal Medicine

## 2012-11-14 ENCOUNTER — Other Ambulatory Visit: Payer: Self-pay

## 2012-11-14 DIAGNOSIS — Z1231 Encounter for screening mammogram for malignant neoplasm of breast: Secondary | ICD-10-CM

## 2012-11-27 ENCOUNTER — Other Ambulatory Visit: Payer: Self-pay | Admitting: Internal Medicine

## 2012-11-29 ENCOUNTER — Telehealth: Payer: Self-pay | Admitting: *Deleted

## 2012-11-29 NOTE — Telephone Encounter (Signed)
Left message for pt to return my call so I can schedule an appt w/ Dr. Magrinat.  

## 2012-11-30 ENCOUNTER — Telehealth: Payer: Self-pay | Admitting: *Deleted

## 2012-11-30 NOTE — Telephone Encounter (Signed)
Pt returned my call & I confirmed 01/04/13 appt w/ pt.  Mailed before appt letter & packet to pt.  Took paperwork to [facility] for chart.

## 2012-12-15 ENCOUNTER — Other Ambulatory Visit: Payer: Self-pay | Admitting: *Deleted

## 2012-12-15 DIAGNOSIS — C50212 Malignant neoplasm of upper-inner quadrant of left female breast: Secondary | ICD-10-CM

## 2013-01-04 ENCOUNTER — Ambulatory Visit (HOSPITAL_BASED_OUTPATIENT_CLINIC_OR_DEPARTMENT_OTHER): Payer: Managed Care, Other (non HMO) | Admitting: Oncology

## 2013-01-04 ENCOUNTER — Ambulatory Visit (HOSPITAL_BASED_OUTPATIENT_CLINIC_OR_DEPARTMENT_OTHER): Payer: Managed Care, Other (non HMO)

## 2013-01-04 ENCOUNTER — Encounter: Payer: Self-pay | Admitting: Oncology

## 2013-01-04 ENCOUNTER — Other Ambulatory Visit (HOSPITAL_BASED_OUTPATIENT_CLINIC_OR_DEPARTMENT_OTHER): Payer: Managed Care, Other (non HMO)

## 2013-01-04 VITALS — BP 110/74 | HR 62 | Temp 98.4°F | Resp 18 | Ht 59.0 in | Wt 136.0 lb

## 2013-01-04 DIAGNOSIS — N952 Postmenopausal atrophic vaginitis: Secondary | ICD-10-CM

## 2013-01-04 DIAGNOSIS — M810 Age-related osteoporosis without current pathological fracture: Secondary | ICD-10-CM

## 2013-01-04 DIAGNOSIS — C50212 Malignant neoplasm of upper-inner quadrant of left female breast: Secondary | ICD-10-CM

## 2013-01-04 DIAGNOSIS — Z853 Personal history of malignant neoplasm of breast: Secondary | ICD-10-CM

## 2013-01-04 DIAGNOSIS — Z8501 Personal history of malignant neoplasm of esophagus: Secondary | ICD-10-CM

## 2013-01-04 DIAGNOSIS — M129 Arthropathy, unspecified: Secondary | ICD-10-CM

## 2013-01-04 LAB — CBC WITH DIFFERENTIAL/PLATELET
Basophils Absolute: 0 10*3/uL (ref 0.0–0.1)
Eosinophils Absolute: 0.2 10*3/uL (ref 0.0–0.5)
HGB: 12.3 g/dL (ref 11.6–15.9)
LYMPH%: 37.2 % (ref 14.0–49.7)
MCV: 93.3 fL (ref 79.5–101.0)
MONO#: 0.4 10*3/uL (ref 0.1–0.9)
MONO%: 10.5 % (ref 0.0–14.0)
NEUT#: 1.6 10*3/uL (ref 1.5–6.5)
Platelets: 260 10*3/uL (ref 145–400)
RDW: 13.4 % (ref 11.2–14.5)

## 2013-01-04 LAB — COMPREHENSIVE METABOLIC PANEL (CC13)
Albumin: 3.6 g/dL (ref 3.5–5.0)
Alkaline Phosphatase: 74 U/L (ref 40–150)
Anion Gap: 8 mEq/L (ref 3–11)
BUN: 17.9 mg/dL (ref 7.0–26.0)
CO2: 27 mEq/L (ref 22–29)
Glucose: 100 mg/dl (ref 70–140)
Potassium: 4.2 mEq/L (ref 3.5–5.1)

## 2013-01-04 MED ORDER — VENLAFAXINE HCL ER 37.5 MG PO CP24: 37.5000 mg | ORAL_CAPSULE | Freq: Every day | ORAL | Status: DC

## 2013-01-04 NOTE — Progress Notes (Signed)
Checked in new patient with no financial issues. She has not been out of the country.she has [facility] form and appt card.

## 2013-01-05 ENCOUNTER — Telehealth: Payer: Self-pay | Admitting: *Deleted

## 2013-01-05 ENCOUNTER — Ambulatory Visit
Admission: RE | Admit: 2013-01-05 | Discharge: 2013-01-05 | Disposition: A | Discharge status: Home / Self Care | Payer: Managed Care, Other (non HMO) | Source: Ambulatory Visit

## 2013-01-05 DIAGNOSIS — Z1231 Encounter for screening mammogram for malignant neoplasm of breast: Secondary | ICD-10-CM

## 2013-01-05 NOTE — Telephone Encounter (Signed)
Lm gv appt for 1.29.14 @ 4pm. Made the pt aware that i will mail a letter/avs...td

## 2013-01-07 NOTE — Progress Notes (Signed)
ID: Ana Wilson OB: 01-27-1956  MR#: [id number]  CSN#:[id number]  PCP: Irena Reichmann, DO GYN:  Tenderness Haygood SU:  OTHER MD:  CHIEF COMPLAINT: "I have a history of breast cancer"  HISTORY OF PRESENT ILLNESS: Ana Wilson was diagnosed with breast cancer in 2002. This was found mammographically, and was not palpable. She's had a left lumpectomy and sentinel lymph node sampling under her Dr. Lorelee New 12/06/2000, with the pathology [phone]) showing 3 separate areas of invasive ductal carcinoma, the largest measuring 1.2 cm, grade 2, with 0 of 2 sentinel lymph nodes involved. The 2 larger tumors had prognostic profile obtained and these showed them to be strongly estrogen and progesterone receptor positive, with a low MIB-1-1, and 1 of the tumors was HER-2 amplified. The other one was not.  She was treated with standard CMF chemotherapy, no trastuzumab, she then received radiation treatments and 5 years of tamoxifen. All this is detailed in the summary below.  More recently the patient presented to Dr. Dierdre Forth with complaints of vaginal atrophy and poorly tolerated hot flashes. The patient is interested in hormone replacement therapy. Note that she status post sinple hysterectomy without salpingo-oophorectomy. She is here to discuss that option.  INTERVAL HISTORY: Ana Wilson was evaluated in the breast clinic 01/04/2013.  REVIEW OF SYSTEMS: She has occasional leg cramps, which are worse at night. These are not new. She thinks she needs a new prescription or perhaps reading glasses. Her dentures don't fit very well. Sometimes she has abdominal discomfort, which is not clearly related to bowel movements. This is rare and not particularly marked. What is bothering her the most of the hot flashes, which occur both during the day and can wake her up at night, and problems with vaginal atrophy, which is severe by her account. Otherwise a detailed review of systems today was  noncontributory  PAST MEDICAL HISTORY: Past Medical History  Diagnosis Date  . 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 Malignant neoplasm of upper-inner quadrant of female breast   . GERD (gastroesophageal reflux disease)   . Osteoporosis   . Arthritis     PAST SURGICAL HISTORY: Past Surgical History  Procedure Laterality Date  . Breast lumpectomy    . Vesicovaginal fistula closure w/ tah    . Cholecystectomy    . Partial hysterectomy      FAMILY HISTORY Family History  Problem Relation Age of Onset  . Lung cancer Father     died at 72ys  . Diabetes type II Maternal Grandmother    the patient's father died from lung cancer at the age of 77. The patient's mother is still living living, currently age 50. The patient has one brother, 3 sisters. There is no history of breast or ovarian cancer in the immediate family. The patient does have 3 cousins with a diagnosis of breast cancer, one diagnosed age 13, the other (a "second cousin" age 35, and third cousin was recently diagnosed, in her mid 24s.  GYNECOLOGIC HISTORY:  Menarche age 68, first live birth age 26, the patient has not had any periods since her hysterectomy remotely. She did not take hormone replacement. She took oral contraceptives remotely for about 7 years, with no unusual side effects or complications  SOCIAL HISTORY:  The patient works as a Diplomatic Services operational officer. Her husband Merton Border is semiretired, but worse at cleaning and demolition sites. Daughter Ana Wilson lives in [place] and works in Insurance account manager  for [employer]. Daughter Ana Wilson lives in [place] and works as a Equities trader. The patient has 3 grandchildren. She has some stepgrandchildren as well. She is a International aid/development worker.     ADVANCED DIRECTIVES: Not in place   HEALTH MAINTENANCE: History  Substance Use Topics  . Smoking status: Former Smoker    Types: Cigarettes    Quit  date: 03/15/1998  . Smokeless tobacco: Never Used  . Alcohol Use: No     Comment: not very often     Colonoscopy: 2011  PAP: Status post hysterectomy  Bone density: 2013  Lipid panel:  Allergies  Allergen Reactions  . Penicillins Hives  . Sulfa Antibiotics Rash    Current Outpatient Prescriptions  Medication Sig Dispense Refill  . alendronate (FOSAMAX) 70 MG tablet Take 70 mg by mouth every 7 (seven) days. Take with a full glass of water on an empty stomach.       . AMITIZA 8 MCG capsule Take 1 capsule by mouth daily.      . Diphenhydramine-PE-APAP (ALLERGY RELIEF PLUS SINUS PO) Take 1 tablet by mouth daily.      . Multiple Vitamin (MULTIVITAMIN) tablet Take 1 tablet by mouth daily.        Marland Kitchen omeprazole (PRILOSEC) 20 MG capsule Take 1 capsule (20 mg total) by mouth daily.  90 capsule  3  . polyethylene glycol powder (GLYCOLAX/MIRALAX) powder DISSOLVE 17 GRAMS (1 CAPFUL) IN BEVERAGE ONCE DAILY.  527 g  1  . venlafaxine XR (EFFEXOR-XR) 37.5 MG 24 hr capsule Take 1 capsule (37.5 mg total) by mouth daily.  90 capsule  4   No current facility-administered medications for this visit.    OBJECTIVE: Middle-aged [place] American woman who appears well Filed Vitals:   01/04/13 1606  BP: 110/74  Pulse: 62  Temp: 98.4 F (36.9 C)  Resp: 18     Body mass index is 27.45 kg/(m^2).    ECOG FS:0 - Asymptomatic  Ocular: Sclerae unicteric, pupils equal, round and reactive to light Ear-nose-throat: Oropharynx clear, dentition fair Lymphatic: No cervical or supraclavicular adenopathy Lungs no rales or rhonchi, good excursion bilaterally Heart regular rate and rhythm, no murmur appreciated Abd soft, nontender, positive bowel sounds MSK no focal spinal tenderness, no joint edema Neuro: non-focal, well-oriented, appropriate affect Breasts: The right breast is unremarkable. The left breast is status post lumpectomy and radiation. There is no evidence of local recurrence. The left axilla is  benign.   LAB RESULTS:  CMP     Component Value Date/Time   NA 141 01/04/2013 1554   NA 139 07/05/2011 0915   K 4.2 01/04/2013 1554   K 4.3 07/05/2011 0915   CL 105 07/06/2012 1027   CL 103 07/05/2011 0915   CO2 27 01/04/2013 1554   CO2 27 07/05/2011 0915   GLUCOSE 100 01/04/2013 1554   GLUCOSE 82 07/06/2012 1027   GLUCOSE 78 07/05/2011 0915   BUN 17.9 01/04/2013 1554   BUN 15 07/05/2011 0915   CREATININE 0.9 01/04/2013 1554   CREATININE 0.93 07/05/2011 0915   CALCIUM 9.7 01/04/2013 1554   CALCIUM 9.8 07/05/2011 0915   PROT 7.5 01/04/2013 1554   PROT 6.8 07/05/2011 0915   ALBUMIN 3.6 01/04/2013 1554   ALBUMIN 4.3 07/05/2011 0915   AST 34 01/04/2013 1554   AST 34 07/05/2011 0915   ALT 20 01/04/2013 1554   ALT 20 07/05/2011 0915   ALKPHOS 74 01/04/2013 1554   ALKPHOS 76 07/05/2011 0915  BILITOT 0.40 01/04/2013 1554   BILITOT 0.4 07/05/2011 0915    I No results found for this basename: SPEP, UPEP,  kappa and lambda light chains    Lab Results  Component Value Date   WBC 3.5* 01/04/2013   NEUTROABS 1.6 01/04/2013   HGB 12.3 01/04/2013   HCT 38.4 01/04/2013   MCV 93.3 01/04/2013   PLT 260 01/04/2013      Chemistry      Component Value Date/Time   NA 141 01/04/2013 1554   NA 139 07/05/2011 0915   K 4.2 01/04/2013 1554   K 4.3 07/05/2011 0915   CL 105 07/06/2012 1027   CL 103 07/05/2011 0915   CO2 27 01/04/2013 1554   CO2 27 07/05/2011 0915   BUN 17.9 01/04/2013 1554   BUN 15 07/05/2011 0915   CREATININE 0.9 01/04/2013 1554   CREATININE 0.93 07/05/2011 0915      Component Value Date/Time   CALCIUM 9.7 01/04/2013 1554   CALCIUM 9.8 07/05/2011 0915   ALKPHOS 74 01/04/2013 1554   ALKPHOS 76 07/05/2011 0915   AST 34 01/04/2013 1554   AST 34 07/05/2011 0915   ALT 20 01/04/2013 1554   ALT 20 07/05/2011 0915   BILITOT 0.40 01/04/2013 1554   BILITOT 0.4 07/05/2011 0915       No results found for this basename: LABCA2    No components found with this basename: LABCA125     No results found for this basename: INR,  in the last 168 hours  Urinalysis No results found for this basename: colorurine, appearanceur, labspec, phurine, glucoseu, hgbur, bilirubinur, ketonesur, proteinur, urobilinogen, nitrite, leukocytesur    STUDIES: Mm Digital Screening  01/05/2013   CLINICAL DATA:  Screening.  EXAM: DIGITAL SCREENING BILATERAL MAMMOGRAM WITH CAD  DIGITAL BREAST TOMOSYNTHESIS  Digital breast tomosynthesis images are acquired in two projections. These images are reviewed in combination with the digital mammogram, confirming the findings below.  COMPARISON:  Previous exam(s).  ACR Breast Density Category b: There are scattered areas of fibroglandular density.  FINDINGS: There are no findings suspicious for malignancy. Images were processed with CAD.  IMPRESSION: No mammographic evidence of malignancy. A result letter of this screening mammogram will be mailed directly to the patient.  RECOMMENDATION: Screening mammogram in one year. (Code:SM-B-01Y)  BI-RADS CATEGORY  1: Negative   Electronically Signed   By: Edwin Cap M.D.   On: 01/05/2013 10:53    ASSESSMENT: 57 y.o. [place] woman  (1) status post left lumpectomy and sentinel lymph node sampling September of 2002 for a mpT1c pN0, stage IA invasive ductal carcinoma, grade 2, estrogen receptor between 78% and 98%, progesterone receptor between 86% and 98%, with an MIB-1 between 7 and 10%, and HER-2 amplified in one of the 2 masses tested (3+), the other one being equivocal by immunohistochemistry but negative by FISH  (2) treated with cyclophosphamide, methotrexate and fluorouracil between October of 2002 and March of 2003  (3) Status post adjuvant radiation to the left breast completed June 2003  (4) 2 tamoxifen adjuvantly between February of 2003 and January of 2008  (5) status post vaginal hysterectomy without salpingo-oophorectomy.  PLAN: We spent the better part of today's hour-plus visit to going over  the patient's diagnosis, treatment history, and prognosis. This specific issue that brings her here today is vaginal atrophy and more generally menopausal symptoms.  In general most menopausal symptoms can be treated without estrogen. The one exception is vaginal atrophy. Lubricants, dilators, and pelvic exercises can  be helpful but do not reverse the process. Local estrogen preparation to can be very helpful in this regard.  The patient understands we do not have data in patients who do not have a history of breast cancer that estrogen alone causes breast cancer. On the other hand in patients with a history of breast cancer estrogen preparations are generally avoided. Accordingly we have very little data in a population such as her.  I would not be uncomfortable however with her receiving either Vagifem suppositories or Estring for her vaginal dryness problems. Since she was uncomfortable with the Vagifem suppositories (which she used only once a week) I suggested she get Estring at try and she of course can obtain this through her gynecologist Dr. Penni Bombard. As far as the hot flashes is concerned, we generally use venlafaxine for this, and we are starting her on 37.5 milligrams daily. She will let us know after 3 or 4 weeks whether or not this works for her. If it does not we will increase the dose to 75 mg daily.  I have made a followup appointment for Gerrie in January. By then we should know whether or these interventions are helping her or our not making any difference. She wanted to go on full estrogen replacement, we would consider starting tamoxifen at the same time, although there is very little data, as already stated, in this population. He is favorable that she is status post simple hysterectomy.  The patient knows to call for any problems that may develop before her next visit here.  Lowella Dell, MD   01/07/2013 10:57 AM

## 2013-01-09 ENCOUNTER — Other Ambulatory Visit: Payer: Self-pay | Admitting: Internal Medicine

## 2013-01-18 ENCOUNTER — Other Ambulatory Visit: Payer: Self-pay

## 2013-01-23 ENCOUNTER — Encounter: Payer: Self-pay | Admitting: *Deleted

## 2013-01-23 NOTE — Progress Notes (Signed)
Mailed after appt letter to pt. 

## 2013-01-26 ENCOUNTER — Other Ambulatory Visit: Payer: Self-pay | Admitting: Internal Medicine

## 2013-02-20 ENCOUNTER — Encounter: Payer: Self-pay | Admitting: *Deleted

## 2013-02-20 NOTE — Progress Notes (Signed)
PT. HAD A PRESCRIPTION FOR VENLAFAXINE ON 01/04/13 AT A RETAIL PHARMACY.

## 2013-02-20 NOTE — Progress Notes (Signed)
RECEIVED A FAX FROM [facility] CONCERNING A MEDICATION NON ADHERENCE THERAPY ADVISORY FOR VENLAFAXINE. THIS FAX WAS GIVEN TO DR.MAGRINAT'S NURSE, VAL DODD,RN.

## 2013-02-23 ENCOUNTER — Telehealth: Payer: Self-pay | Admitting: *Deleted

## 2013-02-23 NOTE — Telephone Encounter (Signed)
Lm that GCM is on call 04/12/13. gv appt for 04/17/13@ 4pm. Made pt aware that i will mail a letter/avs...td

## 2013-02-27 ENCOUNTER — Telehealth: Payer: Self-pay | Admitting: Oncology

## 2013-03-26 ENCOUNTER — Other Ambulatory Visit: Payer: Self-pay | Admitting: Internal Medicine

## 2013-04-12 ENCOUNTER — Ambulatory Visit: Payer: Managed Care, Other (non HMO) | Admitting: Oncology

## 2013-04-17 ENCOUNTER — Ambulatory Visit: Payer: Managed Care, Other (non HMO) | Admitting: Oncology

## 2013-05-28 ENCOUNTER — Encounter: Payer: Self-pay | Admitting: Internal Medicine

## 2013-05-31 ENCOUNTER — Other Ambulatory Visit: Payer: Self-pay | Admitting: Internal Medicine

## 2013-06-25 ENCOUNTER — Other Ambulatory Visit: Payer: Self-pay | Admitting: Family Medicine

## 2013-06-25 ENCOUNTER — Ambulatory Visit
Admission: RE | Admit: 2013-06-25 | Discharge: 2013-06-25 | Disposition: A | Discharge status: Home / Self Care | Payer: Managed Care, Other (non HMO) | Source: Ambulatory Visit | Attending: Family Medicine | Admitting: Family Medicine

## 2013-06-25 ENCOUNTER — Other Ambulatory Visit: Payer: Self-pay | Admitting: Gastroenterology

## 2013-06-25 DIAGNOSIS — R911 Solitary pulmonary nodule: Secondary | ICD-10-CM

## 2013-07-25 ENCOUNTER — Telehealth: Payer: Self-pay | Admitting: Internal Medicine

## 2013-07-26 MED ORDER — POLYETHYLENE GLYCOL 3350 17 GM/SCOOP PO POWD: ORAL | Status: DC

## 2013-07-26 NOTE — Telephone Encounter (Signed)
Refilled Miralax.

## 2013-09-19 ENCOUNTER — Telehealth: Payer: Self-pay | Admitting: Internal Medicine

## 2013-09-19 ENCOUNTER — Other Ambulatory Visit: Payer: Self-pay | Admitting: Internal Medicine

## 2013-09-19 ENCOUNTER — Other Ambulatory Visit: Payer: Self-pay

## 2013-09-19 DIAGNOSIS — Z9889 Other specified postprocedural states: Secondary | ICD-10-CM

## 2013-09-19 DIAGNOSIS — Z853 Personal history of malignant neoplasm of breast: Secondary | ICD-10-CM

## 2013-09-19 DIAGNOSIS — Z1231 Encounter for screening mammogram for malignant neoplasm of breast: Secondary | ICD-10-CM

## 2013-09-19 MED ORDER — POLYETHYLENE GLYCOL 3350 17 GM/SCOOP PO POWD: ORAL | Status: DC

## 2013-09-19 NOTE — Telephone Encounter (Signed)
Refilled Miralax.

## 2013-10-05 ENCOUNTER — Other Ambulatory Visit: Payer: Self-pay | Admitting: Internal Medicine

## 2013-10-22 ENCOUNTER — Other Ambulatory Visit: Payer: Self-pay | Admitting: Family Medicine

## 2013-10-22 DIAGNOSIS — M810 Age-related osteoporosis without current pathological fracture: Secondary | ICD-10-CM

## 2013-11-06 HISTORY — PX: MOUTH SURGERY: SHX715

## 2013-12-06 ENCOUNTER — Telehealth: Payer: Self-pay | Admitting: *Deleted

## 2013-12-06 NOTE — Telephone Encounter (Signed)
Per Dr. Henrene Pastor patient email for constipation. Needs OV with extender. Spoke with patient and scheduled on 12/13/13 at 2:30 PM with Tye Savoy, NP.

## 2013-12-13 ENCOUNTER — Encounter: Payer: Self-pay | Admitting: Nurse Practitioner

## 2013-12-13 ENCOUNTER — Ambulatory Visit (INDEPENDENT_AMBULATORY_CARE_PROVIDER_SITE_OTHER): Payer: Managed Care, Other (non HMO) | Admitting: Nurse Practitioner

## 2013-12-13 VITALS — BP 110/68 | HR 64 | Ht 58.75 in | Wt 140.1 lb

## 2013-12-13 DIAGNOSIS — K589 Irritable bowel syndrome without diarrhea: Secondary | ICD-10-CM

## 2013-12-13 DIAGNOSIS — K5909 Other constipation: Secondary | ICD-10-CM

## 2013-12-13 MED ORDER — LINACLOTIDE 290 MCG PO CAPS: 290.0000 ug | ORAL_CAPSULE | Freq: Every day | ORAL | Status: DC

## 2013-12-13 NOTE — Progress Notes (Signed)
     History of Present Illness:  Patient is a 58 year old female known to Dr. Henrene Pastor. She has a history of IBS/constipation and GERD. Patient was last seen May 2014. Since then she has been evaluated by Dr. Candis Shine at [facility] who started her on Amitiza , low gas diet, probiotics. Activated charcoal was recommended for body odor. Resolution of constipation was documented on last [facility] visit in March 2015 but patient tells me constipation didn't improve on amitiza, only the cramps and that she still required the Miralax. Now it seems like miralax has lost effect. She stopped Amitiza a few weeks ago to reduce the number of home medications she was taking. Since off the Amitiza, abdominal cramps are recurring.   Patient continues to  complain of a foul body odor which smells like feces. This has been a chronic problem for her and is worse when constipated, sweating or when her bladder is full. She has tried activated Charcoal but it didn't help.   Current Medications, Allergies, Past Medical History, Past Surgical History, Family History and Social History were reviewed in [employer] record.  Physical Exam: General: Pleasant, well developed , black female in no acute distress Head: Normocephalic and atraumatic Eyes:  sclerae anicteric, conjunctiva pink  Ears: Normal auditory acuity Lungs: Clear throughout to auscultation Heart: Regular rate and rhythm Abdomen: Soft, non distended, non-tender. No masses, no hepatomegaly. Normal bowel sounds Musculoskeletal: Symmetrical with no gross deformities  Extremities: No edema  Neurological: Alert oriented x 4, grossly nonfocal Psychological:  Alert and cooperative. Normal mood and affect  Assessment and Recommendations:  1. Pleasant 58 year old female with constipation predominant IBS. Amitiza helped cramps but not constipation. Miralax losing efficacy. Trial of Linzess, the higher dose, which is indicated in patients with  associated cramps. Hopefully this will help constipation AND cramps.  2. Foul body odor (per patient). This has been a chronic problems and is believed to be due,at least in part, to how the body processes certain foods.  She has eliminated many things from diet but nothing seems to help. She is frustrated about this, nearly despondent. Of note, I didn't smell anything malodorous, not even when examining her up close.

## 2013-12-13 NOTE — Patient Instructions (Addendum)
We have sent the following medications to your pharmacy for you to pick up at your convenience: Linzess 290 mcg, please take one capsule by mouth once daily on an empty stomach.  Take thirty minutes before breakfast.  Hold Miralax

## 2013-12-14 DIAGNOSIS — K5900 Constipation, unspecified: Secondary | ICD-10-CM | POA: Insufficient documentation

## 2013-12-14 DIAGNOSIS — K589 Irritable bowel syndrome without diarrhea: Secondary | ICD-10-CM | POA: Insufficient documentation

## 2013-12-17 NOTE — Progress Notes (Signed)
Agree with initial assessment and plans 

## 2014-01-07 ENCOUNTER — Ambulatory Visit: Payer: Managed Care, Other (non HMO)

## 2014-01-07 ENCOUNTER — Ambulatory Visit
Admission: RE | Admit: 2014-01-07 | Discharge: 2014-01-07 | Disposition: A | Discharge status: Home / Self Care | Payer: Managed Care, Other (non HMO) | Source: Ambulatory Visit | Attending: Family Medicine | Admitting: Family Medicine

## 2014-01-07 ENCOUNTER — Ambulatory Visit
Admission: RE | Admit: 2014-01-07 | Discharge: 2014-01-07 | Disposition: A | Discharge status: Home / Self Care | Payer: Managed Care, Other (non HMO) | Source: Ambulatory Visit

## 2014-01-07 DIAGNOSIS — M810 Age-related osteoporosis without current pathological fracture: Secondary | ICD-10-CM

## 2014-01-07 DIAGNOSIS — Z853 Personal history of malignant neoplasm of breast: Secondary | ICD-10-CM

## 2014-01-07 DIAGNOSIS — Z1231 Encounter for screening mammogram for malignant neoplasm of breast: Secondary | ICD-10-CM

## 2014-01-07 DIAGNOSIS — Z9889 Other specified postprocedural states: Secondary | ICD-10-CM

## 2014-01-10 ENCOUNTER — Other Ambulatory Visit: Payer: Self-pay | Admitting: Family Medicine

## 2014-01-10 DIAGNOSIS — M549 Dorsalgia, unspecified: Secondary | ICD-10-CM

## 2014-01-11 ENCOUNTER — Ambulatory Visit
Admission: RE | Admit: 2014-01-11 | Discharge: 2014-01-11 | Disposition: A | Discharge status: Home / Self Care | Payer: Managed Care, Other (non HMO) | Source: Ambulatory Visit | Attending: Family Medicine | Admitting: Family Medicine

## 2014-01-11 DIAGNOSIS — M549 Dorsalgia, unspecified: Secondary | ICD-10-CM

## 2014-03-13 ENCOUNTER — Other Ambulatory Visit: Payer: Self-pay | Admitting: Nurse Practitioner

## 2014-04-22 ENCOUNTER — Other Ambulatory Visit: Payer: Self-pay | Admitting: Internal Medicine

## 2014-04-30 ENCOUNTER — Other Ambulatory Visit: Payer: Self-pay | Admitting: Internal Medicine

## 2014-06-17 ENCOUNTER — Other Ambulatory Visit: Payer: Self-pay | Admitting: Nurse Practitioner

## 2014-10-22 ENCOUNTER — Other Ambulatory Visit: Payer: Self-pay | Admitting: Internal Medicine

## 2014-12-09 ENCOUNTER — Other Ambulatory Visit: Payer: Self-pay

## 2014-12-09 DIAGNOSIS — Z1231 Encounter for screening mammogram for malignant neoplasm of breast: Secondary | ICD-10-CM

## 2014-12-19 ENCOUNTER — Telehealth: Payer: Self-pay | Admitting: Internal Medicine

## 2014-12-19 NOTE — Telephone Encounter (Signed)
Patient notified that according to the office note from 07/24/2012.  Patient notified of the recommendation. She is not having any issues.  She is advised that if she has any changes in bowel habits, rectal bleeding or other GI concerns she should call to schedule an office visit.  She is also advised that she will be sent a letter to schedule in 03/2019

## 2015-01-09 ENCOUNTER — Ambulatory Visit
Admission: RE | Admit: 2015-01-09 | Discharge: 2015-01-09 | Disposition: A | Discharge status: Home / Self Care | Payer: Managed Care, Other (non HMO) | Source: Ambulatory Visit

## 2015-01-09 DIAGNOSIS — Z1231 Encounter for screening mammogram for malignant neoplasm of breast: Secondary | ICD-10-CM

## 2015-03-26 ENCOUNTER — Other Ambulatory Visit: Payer: Self-pay | Admitting: Internal Medicine

## 2015-10-27 ENCOUNTER — Other Ambulatory Visit: Payer: Self-pay | Admitting: Internal Medicine

## 2015-12-05 ENCOUNTER — Other Ambulatory Visit: Payer: Self-pay | Admitting: Family Medicine

## 2015-12-05 DIAGNOSIS — Z1231 Encounter for screening mammogram for malignant neoplasm of breast: Secondary | ICD-10-CM

## 2015-12-10 ENCOUNTER — Other Ambulatory Visit: Payer: Self-pay | Admitting: Family Medicine

## 2015-12-10 DIAGNOSIS — Z13820 Encounter for screening for osteoporosis: Secondary | ICD-10-CM

## 2015-12-12 ENCOUNTER — Other Ambulatory Visit: Payer: Self-pay | Admitting: Family Medicine

## 2015-12-12 DIAGNOSIS — M810 Age-related osteoporosis without current pathological fracture: Secondary | ICD-10-CM

## 2016-01-12 ENCOUNTER — Other Ambulatory Visit: Payer: Self-pay | Admitting: Family Medicine

## 2016-01-12 ENCOUNTER — Ambulatory Visit
Admission: RE | Admit: 2016-01-12 | Discharge: 2016-01-12 | Disposition: A | Discharge status: Home / Self Care | Payer: Managed Care, Other (non HMO) | Source: Ambulatory Visit | Attending: Family Medicine | Admitting: Family Medicine

## 2016-01-12 ENCOUNTER — Ambulatory Visit: Payer: Managed Care, Other (non HMO)

## 2016-01-12 DIAGNOSIS — R918 Other nonspecific abnormal finding of lung field: Secondary | ICD-10-CM

## 2016-01-12 DIAGNOSIS — Z1231 Encounter for screening mammogram for malignant neoplasm of breast: Secondary | ICD-10-CM

## 2016-01-12 DIAGNOSIS — M810 Age-related osteoporosis without current pathological fracture: Secondary | ICD-10-CM

## 2016-01-15 ENCOUNTER — Other Ambulatory Visit: Payer: Self-pay | Admitting: Internal Medicine

## 2016-07-21 ENCOUNTER — Other Ambulatory Visit (INDEPENDENT_AMBULATORY_CARE_PROVIDER_SITE_OTHER): Payer: Managed Care, Other (non HMO)

## 2016-07-21 ENCOUNTER — Encounter: Payer: Self-pay | Admitting: Gastroenterology

## 2016-07-21 ENCOUNTER — Ambulatory Visit (INDEPENDENT_AMBULATORY_CARE_PROVIDER_SITE_OTHER): Payer: Managed Care, Other (non HMO) | Admitting: Gastroenterology

## 2016-07-21 VITALS — BP 120/62 | HR 70 | Ht 59.0 in | Wt 161.0 lb

## 2016-07-21 DIAGNOSIS — R1010 Upper abdominal pain, unspecified: Secondary | ICD-10-CM

## 2016-07-21 LAB — COMPREHENSIVE METABOLIC PANEL
ALK PHOS: 115 U/L (ref 39–117)
ALT: 65 U/L — ABNORMAL HIGH (ref 0–35)
AST: 29 U/L (ref 0–37)
Albumin: 4.4 g/dL (ref 3.5–5.2)
BUN: 13 mg/dL (ref 6–23)
CHLORIDE: 102 meq/L (ref 96–112)
CO2: 30 mEq/L (ref 19–32)
Calcium: 10.2 mg/dL (ref 8.4–10.5)
Creatinine, Ser: 0.99 mg/dL (ref 0.40–1.20)
GFR: 73.41 mL/min (ref >60.00)
GLUCOSE: 90 mg/dL (ref 70–99)
POTASSIUM: 4.5 meq/L (ref 3.5–5.1)
SODIUM: 139 meq/L (ref 135–145)
TOTAL PROTEIN: 7.7 g/dL (ref 6.0–8.3)
Total Bilirubin: 0.4 mg/dL (ref 0.2–1.2)

## 2016-07-21 LAB — CBC
HCT: 40 % (ref 36.0–46.0)
HEMOGLOBIN: 13.1 g/dL (ref 12.0–15.0)
MCHC: 32.7 g/dL (ref 30.0–36.0)
MCV: 91.3 fl (ref 78.0–100.0)
PLATELETS: 340 10*3/uL (ref 150.0–400.0)
RBC: 4.39 Mil/uL (ref 3.87–5.11)
RDW: 13.8 % (ref 11.5–15.5)
WBC: 4.9 10*3/uL (ref 4.0–10.5)

## 2016-07-21 LAB — LIPASE: Lipase: 25 U/L (ref 11.0–59.0)

## 2016-07-21 MED ORDER — HYOSCYAMINE SULFATE 0.125 MG SL SUBL: 0.1250 mg | SUBLINGUAL_TABLET | Freq: Four times a day (QID) | SUBLINGUAL | 0 refills | Status: DC | PRN

## 2016-07-21 MED ORDER — POLYETHYLENE GLYCOL 3350 17 GM/SCOOP PO POWD: 1.0000 | Freq: Two times a day (BID) | ORAL | 3 refills | Status: DC

## 2016-07-21 NOTE — Progress Notes (Signed)
Assessment and plans reviewed  

## 2016-07-21 NOTE — Patient Instructions (Addendum)
If you are age 61 or older, your body mass index should be between 23-30. Your Body mass index is 32.52 kg/m. If this is out of the aforementioned range listed, please consider follow up with your Primary Care Provider.  If you are age 16 or younger, your body mass index should be between 19-25. Your Body mass index is 32.52 kg/m. If this is out of the aformentioned range listed, please consider follow up with your Primary Care Provider.   Your physician has requested that you go to the basement for the following lab work before leaving today: CMET CBC Lipase  We have sent the following medications to your pharmacy for you to pick up at your convenience: Levsin Miralax  Thank you for choosing me and [facility].  Alonza Bogus, PA-C

## 2016-07-21 NOTE — Progress Notes (Addendum)
07/21/2016 Ana Wilson [id number] 01/05/1956   HISTORY OF PRESENT ILLNESS:  This is a pleasant 61 year old female who is known to Dr. Henrene Pastor previously for constipation and reflux issues. Reports a history of irritable bowel syndrome. She is here today at the request of her PCP, Dr. Theda Sers, for evaluation regarding episodes of abdominal pain. The patient reports that she had her gallbladder removed about 6 years ago for gallstones. About a year later she started having episodes that would occur about once a year where she would get severe upper abdominal pain radiating around to both sides of her back. Since it only occurred once a year she never really seeked evaluation for it. Then, last week she had 2 episodes only 2 days apart. She saw her PCP and reportedly had an abdominal x-ray that showed stool throughout her colon. CT scan was performed the following day and showed no acute findings with the out within the abdomen or pelvis. Bile ducts appeared normal. No labs were performed. She says that when this occurs the pain is horrendous. Says that this intense pain usually goes away after 15 and 20 minutes, but then she just remains sore and "wiped out" for hours to days after.  She has some nausea associated with this, but no vomiting.  In regards to her constipation, she says that currently she is taking fiber daily and using Dulcolax as needed, which is usually about 3 days a week. She reports that previously she had tried Amitiza and Linzess 290 mcg without great results.  Had been on MiraLAX once daily in the past but felt like it lost its effectiveness.  Last colonoscopy in 2011 elsewhere that was reportedly normal.   Past Medical History:  Diagnosis Date  . Arthritis   . 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 GERD (gastroesophageal reflux disease)   . Malignant neoplasm of upper-inner quadrant of female breast ([place])   .  Osteoporosis    Past Surgical History:  Procedure Laterality Date  . BREAST LUMPECTOMY    . CHOLECYSTECTOMY    . MOUTH SURGERY  11/06/2013   2 implants put in  . PARTIAL HYSTERECTOMY    . VESICOVAGINAL FISTULA CLOSURE W/ TAH      reports that she quit smoking about 18 years ago. Her smoking use included Cigarettes. She has never used smokeless tobacco. She reports that she drinks alcohol. She reports that she does not use drugs. family history includes Diabetes type II in her maternal grandmother; Lung cancer in her father; Uterine cancer in her sister. Allergies  Allergen Reactions  . Penicillins Hives  . Sulfa Antibiotics Rash      Outpatient Encounter Prescriptions as of 07/21/2016  Medication Sig  . alendronate (FOSAMAX) 70 MG tablet Take 70 mg by mouth once a week. Take with a full glass of water on an empty stomach.  . bisacodyl (DULCOLAX) 5 MG EC tablet Take 5 mg by mouth daily as needed for moderate constipation.  . Calcium Carbonate-Vitamin D (CALCIUM + D PO) Take 1,500 mg by mouth daily. Pt taking OTC calcium +D, 1500 mg daily  . Diphenhydramine-PE-APAP (ALLERGY RELIEF PLUS SINUS PO) Take 1 tablet by mouth daily.  . Multiple Vitamin (MULTIVITAMIN) tablet Take 1 tablet by mouth daily.    Marland Kitchen omeprazole (PRILOSEC) 20 MG capsule TAKE 1 CAPSULE ONCE A DAY.  . Probiotic Product (PROBIOTIC DAILY PO) Take by mouth.  Marland Kitchen  Psyllium Husk POWD by Does not apply route.  . [DISCONTINUED] LINZESS 290 MCG CAPS capsule TAKE 1 CAPSULE BY MOUTH DAILY.  . [DISCONTINUED] polyethylene glycol powder (GLYCOLAX/MIRALAX) powder DISSOLVE 17 GMS (1 CAPFUL) IN BEVERAGE ONCE DAILY.   No facility-administered encounter medications on file as of 07/21/2016.      REVIEW OF SYSTEMS  : All other systems reviewed and negative except where noted in the History of Present Illness.   PHYSICAL EXAM: BP 120/62   Pulse 70   Ht 4\' 11"  (1.499 m)   Wt 161 lb (73 kg)   BMI 32.52 kg/m  General: Well developed  female in no acute distress Head: Normocephalic and atraumatic Eyes:  Sclerae anicteric, conjunctiva pink. Ears: Normal auditory acuity Lungs: Clear throughout to auscultation; no increased WOB. Heart: Regular rate and rhythm Abdomen: Soft, non-distended. Normal bowel sounds.  Non-tender. Musculoskeletal: Symmetrical with no gross deformities  Skin: No lesions on visible extremities Extremities: No edema  Neurological: Alert oriented x 4, grossly non-focal Psychological:  Alert and cooperative. Normal mood and affect  ASSESSMENT AND PLAN: -61 year old female with episodes of severe upper abdominal pain:  Episodes last about 20 mins with the very severe pain but then she is just sore and "wiped out" for hours to days after.  It sounds to me that maybe she is passing gallstones intermittently.  Does not sound classic for bowel obstruction although is what her PCP was considering.  Last episode one week ago.  I am going to check labs today including a CBC, CMP, and lipase.  Will give levsin SL to try when the pain occurs.  If she has another episode of pain have asked her to come to the ER for evaluation to see if we can get any idea of what is occurring during this time. -Chronic constipation:  Will start back on Miralax at BID dosing to see if that helps.  **Of note, I did get the results of her abdominal x-ray from May 2. This showed possible transition point in the mid descending colon with paucity of distal large bowel gas and significant stool in the proximal colon raising the question of mass or obstruction at the level of the mid descending colon. Once again she had a CT scan in the next day that was unremarkable.  CC:  Janie Morning, DO

## 2016-07-23 ENCOUNTER — Telehealth: Payer: Self-pay

## 2016-07-23 NOTE — Telephone Encounter (Signed)
Pt returning Linda's call 

## 2016-07-23 NOTE — Telephone Encounter (Signed)
Pt scheduled for previsit 07/30/16@8am , colon scheduled with Dr. Henrene Pastor in the [facility] 08/19/16@9am . Pt aware of appts.

## 2016-07-23 NOTE — Telephone Encounter (Signed)
-----   Message from Loralie Champagne, PA-C sent at 07/23/2016 11:44 AM EDT ----- Regarding: FW: Colonoscopy in [place]   ----- Message ----- From: Loralie Champagne, PA-C Sent: 07/23/2016  11:41 AM To: Irene Shipper, MD Subject: RE: Colonoscopy in [place]                         I will see if Vaughan Basta will do that.  I did not have the x-ray results, just the CT scan, when I saw her in clinic.  Vaughan Basta can you please set this up?  When we received her x-ray results it suggested a possible abnormality in her colon.  Thank you,  Jess   ----- Message ----- From: Irene Shipper, MD Sent: 07/23/2016  10:38 AM To: Loralie Champagne, PA-C Subject: Colonoscopy in [place], I reviewed your note on this patient. I have not seen her since 2014. Her last colonoscopy was 2011 and performed elsewhere. I think it would be wise to set her up for outpatient colonoscopy in the [place] to rule out any partially obstructive process given questions raised on imaging. Thank you very much. Jenny Reichmann

## 2016-07-30 ENCOUNTER — Telehealth: Payer: Self-pay | Admitting: Nurse Practitioner

## 2016-07-30 ENCOUNTER — Ambulatory Visit (AMBULATORY_SURGERY_CENTER): Payer: Self-pay

## 2016-07-30 VITALS — Ht 59.0 in | Wt 162.4 lb

## 2016-07-30 DIAGNOSIS — R1010 Upper abdominal pain, unspecified: Secondary | ICD-10-CM

## 2016-07-30 MED ORDER — NA SULFATE-K SULFATE-MG SULF 17.5-3.13-1.6 GM/177ML PO SOLN: 1.0000 | Freq: Once | ORAL | 0 refills | Status: AC

## 2016-07-30 MED ORDER — POLYETHYLENE GLYCOL 3350 17 GM/SCOOP PO POWD: 17.0000 g | Freq: Two times a day (BID) | ORAL | 3 refills | Status: DC

## 2016-07-30 NOTE — Progress Notes (Signed)
Denies allergies to eggs or soy products. Denies complication of anesthesia or sedation. Denies use of weight loss medication. Denies use of O2.   Emmi instructions given for colonoscopy.  

## 2016-07-30 NOTE — Telephone Encounter (Signed)
Sent script in and left message on machine.

## 2016-08-05 ENCOUNTER — Encounter: Payer: Self-pay | Admitting: Internal Medicine

## 2016-08-19 ENCOUNTER — Ambulatory Visit (AMBULATORY_SURGERY_CENTER): Payer: Managed Care, Other (non HMO) | Admitting: Internal Medicine

## 2016-08-19 ENCOUNTER — Encounter: Payer: Self-pay | Admitting: Internal Medicine

## 2016-08-19 VITALS — BP 113/55 | HR 63 | Temp 96.2°F | Resp 12 | Ht 59.0 in | Wt 162.0 lb

## 2016-08-19 DIAGNOSIS — R935 Abnormal findings on diagnostic imaging of other abdominal regions, including retroperitoneum: Secondary | ICD-10-CM

## 2016-08-19 DIAGNOSIS — R1010 Upper abdominal pain, unspecified: Secondary | ICD-10-CM | POA: Diagnosis not present

## 2016-08-19 DIAGNOSIS — K5901 Slow transit constipation: Secondary | ICD-10-CM

## 2016-08-19 MED ORDER — SODIUM CHLORIDE 0.9 % IV SOLN: 500.0000 mL | INTRAVENOUS | Status: DC

## 2016-08-19 NOTE — Op Note (Signed)
[place] Patient Name: Ana Wilson Procedure Date: 08/19/2016 9:13 AM MRN: [id number] Endoscopist: Docia Chuck. Henrene Pastor , MD Age: 61 Referring MD:  Date of Birth: Jul 27, 1955 Gender: Female Account #: [id number] Procedure:                Colonoscopy Indications:              Abdominal pain, Abnormal abdominal x-ray of the GI                            tract, Constipation. Previous exam elsewhere (Dr.                            Melina Copa) 2011 reportedly negative Medicines:                Monitored Anesthesia Care Procedure:                Pre-Anesthesia Assessment:                           - Prior to the procedure, a History and Physical                            was performed, and patient medications and                            allergies were reviewed. The patient's tolerance of                            previous anesthesia was also reviewed. The risks                            and benefits of the procedure and the sedation                            options and risks were discussed with the patient.                            All questions were answered, and informed consent                            was obtained. Prior Anticoagulants: The patient has                            taken no previous anticoagulant or antiplatelet                            agents. ASA Grade Assessment: II - A patient with                            mild systemic disease. After reviewing the risks                            and benefits, the patient was deemed in  satisfactory condition to undergo the procedure.                           After obtaining informed consent, the colonoscope                            was passed under direct vision. Throughout the                            procedure, the patient's blood pressure, pulse, and                            oxygen saturations were monitored continuously. The                            Colonoscope was introduced  through the anus and                            advanced to the the cecum, identified by                            appendiceal orifice and ileocecal valve. The                            ileocecal valve, appendiceal orifice, and rectum                            were photographed. The quality of the bowel                            preparation was excellent. The colonoscopy was                            performed without difficulty. The patient tolerated                            the procedure well. The bowel preparation used was                            SUPREP. Scope In: 9:29:50 AM Scope Out: 9:37:10 AM Scope Withdrawal Time: 0 hours 6 minutes 21 seconds  Total Procedure Duration: 0 hours 7 minutes 20 seconds  Findings:                 The entire examined colon appeared normal on direct                            and retroflexion views. Complications:            No immediate complications. Estimated blood loss:                            None. Estimated Blood Loss:     Estimated blood loss: none. Impression:               - The entire examined colon is normal on  direct and                            retroflexion views.                           - No specimens collected. Recommendation:           - Repeat colonoscopy in 10 years for screening                            purposes.                           - Patient has a contact number available for                            emergencies. The signs and symptoms of potential                            delayed complications were discussed with the                            patient. Return to normal activities tomorrow.                            Written discharge instructions were provided to the                            patient.                           - Resume previous diet.                           - Continue present medications. Docia Chuck. Henrene Pastor, MD 08/19/2016 9:41:13 AM This report has been signed electronically.

## 2016-08-19 NOTE — Patient Instructions (Signed)
YOU HAD AN ENDOSCOPIC PROCEDURE TODAY AT THE [facility]:   Refer to the procedure report that was given to you for any specific questions about what was found during the examination.  If the procedure report does not answer your questions, please call your gastroenterologist to clarify.  If you requested that your care partner not be given the details of your procedure findings, then the procedure report has been included in a sealed envelope for you to review at your convenience later.  YOU SHOULD EXPECT: Some feelings of bloating in the abdomen. Passage of more gas than usual.  Walking can help get rid of the air that was put into your GI tract during the procedure and reduce the bloating. If you had a lower endoscopy (such as a colonoscopy or flexible sigmoidoscopy) you may notice spotting of blood in your stool or on the toilet paper. If you underwent a bowel prep for your procedure, you may not have a normal bowel movement for a few days.  Please Note:  You might notice some irritation and congestion in your nose or some drainage.  This is from the oxygen used during your procedure.  There is no need for concern and it should clear up in a day or so.  SYMPTOMS TO REPORT IMMEDIATELY:   Following lower endoscopy (colonoscopy or flexible sigmoidoscopy):  Excessive amounts of blood in the stool  Significant tenderness or worsening of abdominal pains  Swelling of the abdomen that is new, acute  Fever of 100F or higher  For urgent or emergent issues, a gastroenterologist can be reached at any hour by calling [phone].   DIET:  We do recommend a small meal at first, but then you may proceed to your regular diet.  Drink plenty of fluids but you should avoid alcoholic beverages for 24 hours.  MEDICATIONS:  Continue present medications.  ACTIVITY:  You should plan to take it easy for the rest of today and you should NOT DRIVE or use heavy machinery until tomorrow (because of the  sedation medicines used during the test).    FOLLOW UP: Our staff will call the number listed on your records the next business day following your procedure to check on you and address any questions or concerns that you may have regarding the information given to you following your procedure. If we do not reach you, we will leave a message.  However, if you are feeling well and you are not experiencing any problems, there is no need to return our call.  We will assume that you have returned to your regular daily activities without incident.  If any biopsies were taken you will be contacted by phone or by letter within the next 1-3 weeks.  Please call us at [phone] if you have not heard about the biopsies in 3 weeks.   Thank you for allowing us to provide for your healthcare needs today.   SIGNATURES/CONFIDENTIALITY: You and/or your care partner have signed paperwork which will be entered into your electronic medical record.  These signatures attest to the fact that that the information above on your After Visit Summary has been reviewed and is understood.  Full responsibility of the confidentiality of this discharge information lies with you and/or your care-partner. 

## 2016-08-19 NOTE — Progress Notes (Signed)
Report to PACU, RN, vss, BBS= Clear.  

## 2016-08-19 NOTE — Progress Notes (Signed)
Pt's states no medical or surgical changes since previsit or office visit. 

## 2016-08-20 ENCOUNTER — Telehealth: Payer: Self-pay | Admitting: *Deleted

## 2016-08-20 NOTE — Telephone Encounter (Signed)
  Follow up Call-  Call back number 08/19/2016  Post procedure Call Back phone  # [phone]  Permission to leave phone message Yes  Some recent data might be hidden     Patient questions:  Do you have a fever, pain , or abdominal swelling? No. Pain Score  0 *  Have you tolerated food without any problems? Yes.    Have you been able to return to your normal activities? Yes.    Do you have any questions about your discharge instructions: Diet   No. Medications  No. Follow up visit  No.  Do you have questions or concerns about your Care? No.  Actions: * If pain score is 4 or above: No action needed, pain <4.

## 2016-10-18 ENCOUNTER — Telehealth: Payer: Self-pay | Admitting: Internal Medicine

## 2016-10-18 NOTE — Telephone Encounter (Signed)
Pt wants to know if there can be a standing order for her to take to [facility] for her to have labs and an US done when she has the abdominal pain. Alonza Bogus PA saw the patient in the office last. Please advise regarding standing orders for labs and [place].

## 2016-10-19 NOTE — Telephone Encounter (Signed)
I do not think that this is going to work.  Her pain only lasts for several minutes and even if there is a standing order then she still has to schedule an ultrasound, she cannot just show up.  I think that she needs to come back in to see Dr. Henrene Pastor to discuss the issue again and see what his thoughts are in regards to evaluating this further.  Thank you,  Jess

## 2016-10-19 NOTE — Telephone Encounter (Signed)
Spoke with pt and she is aware. States she wants to hold off for a while. Pt states she will call back if she wants to schedule the appt.

## 2016-11-23 ENCOUNTER — Other Ambulatory Visit: Payer: Self-pay | Admitting: Internal Medicine

## 2016-11-30 ENCOUNTER — Other Ambulatory Visit: Payer: Self-pay | Admitting: Family Medicine

## 2016-11-30 ENCOUNTER — Other Ambulatory Visit: Payer: Self-pay | Admitting: Obstetrics and Gynecology

## 2016-11-30 DIAGNOSIS — Z1231 Encounter for screening mammogram for malignant neoplasm of breast: Secondary | ICD-10-CM

## 2017-01-12 ENCOUNTER — Ambulatory Visit
Admission: RE | Admit: 2017-01-12 | Discharge: 2017-01-12 | Disposition: A | Discharge status: Home / Self Care | Payer: Managed Care, Other (non HMO) | Source: Ambulatory Visit | Attending: Obstetrics and Gynecology | Admitting: Obstetrics and Gynecology

## 2017-01-12 DIAGNOSIS — Z1231 Encounter for screening mammogram for malignant neoplasm of breast: Secondary | ICD-10-CM

## 2017-01-12 HISTORY — DX: Personal history of irradiation: Z92.3

## 2017-01-12 HISTORY — DX: Personal history of antineoplastic chemotherapy: Z92.21

## 2017-04-01 ENCOUNTER — Other Ambulatory Visit: Payer: Self-pay | Admitting: Physician Assistant

## 2017-04-01 ENCOUNTER — Ambulatory Visit
Admission: RE | Admit: 2017-04-01 | Discharge: 2017-04-01 | Disposition: A | Discharge status: Home / Self Care | Payer: Managed Care, Other (non HMO) | Source: Ambulatory Visit | Attending: Physician Assistant | Admitting: Physician Assistant

## 2017-04-01 DIAGNOSIS — R0602 Shortness of breath: Secondary | ICD-10-CM

## 2017-04-01 DIAGNOSIS — R05 Cough: Secondary | ICD-10-CM

## 2017-04-01 DIAGNOSIS — R059 Cough, unspecified: Secondary | ICD-10-CM

## 2017-04-01 MED ORDER — IOPAMIDOL (ISOVUE-370) INJECTION 76%
75.0000 mL | Freq: Once | INTRAVENOUS | Status: DC | PRN
Start: 2017-04-01 — End: 2017-04-02

## 2017-11-09 ENCOUNTER — Other Ambulatory Visit: Payer: Self-pay | Admitting: Family Medicine

## 2017-11-09 DIAGNOSIS — Z1231 Encounter for screening mammogram for malignant neoplasm of breast: Secondary | ICD-10-CM

## 2017-11-09 DIAGNOSIS — M810 Age-related osteoporosis without current pathological fracture: Secondary | ICD-10-CM

## 2017-12-17 ENCOUNTER — Other Ambulatory Visit: Payer: Self-pay | Admitting: Internal Medicine

## 2017-12-28 ENCOUNTER — Encounter: Payer: Self-pay | Admitting: Genetic Counselor

## 2017-12-28 ENCOUNTER — Telehealth: Payer: Self-pay | Admitting: Genetic Counselor

## 2017-12-28 NOTE — Telephone Encounter (Signed)
Pt cld to schedule a genetic counseling appt. Pt has a personal hx of breast cancer. She has been scheduled to see Roma Kayser on 11/11 at 2pm. Letter mailed.

## 2018-01-23 ENCOUNTER — Encounter: Payer: Self-pay | Admitting: Genetic Counselor

## 2018-01-23 ENCOUNTER — Ambulatory Visit
Admission: RE | Admit: 2018-01-23 | Discharge: 2018-01-23 | Disposition: A | Discharge status: Home / Self Care | Payer: Managed Care, Other (non HMO) | Source: Ambulatory Visit | Attending: Family Medicine | Admitting: Family Medicine

## 2018-01-23 ENCOUNTER — Inpatient Hospital Stay: Payer: Managed Care, Other (non HMO) | Attending: Genetic Counselor | Admitting: Genetic Counselor

## 2018-01-23 ENCOUNTER — Inpatient Hospital Stay: Payer: Managed Care, Other (non HMO)

## 2018-01-23 DIAGNOSIS — Z853 Personal history of malignant neoplasm of breast: Secondary | ICD-10-CM

## 2018-01-23 DIAGNOSIS — Z1231 Encounter for screening mammogram for malignant neoplasm of breast: Secondary | ICD-10-CM

## 2018-01-23 DIAGNOSIS — M810 Age-related osteoporosis without current pathological fracture: Secondary | ICD-10-CM

## 2018-01-23 DIAGNOSIS — Z803 Family history of malignant neoplasm of breast: Secondary | ICD-10-CM | POA: Diagnosis not present

## 2018-01-23 NOTE — Progress Notes (Signed)
REFERRING PROVIDER: Maggie Schwalbe, PA-C [place], [place] [address]  PRIMARY PROVIDER:  Nodal, Alphonzo Dublin, PA-C  PRIMARY REASON FOR VISIT:  1. Family history of breast cancer   2. History of breast cancer      HISTORY OF PRESENT ILLNESS:   Ana Wilson, a 62 y.o. female, was seen for a [facility] cancer genetics consultation at the request of Dr. Clint Bolder due to a personal and family history of cancer.  [name] presents to clinic today to discuss the possibility of a hereditary predisposition to cancer, genetic testing, and to further clarify her future cancer risks, as well as potential cancer risks for family members.   In 2002, at the age of 68, [name] was diagnosed with cancer of the left breast. This was treated with lumpectomy, chemotherapy, radiation and tamoxifen.     CANCER HISTORY:   No history exists.     HORMONAL RISK FACTORS:  Menarche was at age 86.  First live birth at age 51.  OCP use for approximately 8 years.  Ovaries intact: yes.  Hysterectomy: yes.  Menopausal status: postmenopausal.  HRT use: 0 years. Colonoscopy: yes; normal. Mammogram within the last year: yes. Number of breast biopsies: 1. Up to date with pelvic exams:  yes. Any excessive radiation exposure in the past:  no  Past Medical History:  Diagnosis Date  . Allergy   . Anxiety   . Arthritis   . 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 Family history of breast cancer   . GERD (gastroesophageal reflux disease)   . IBS (irritable bowel syndrome)   . Kidney stone   . Malignant neoplasm of upper-inner quadrant of female breast ([place])   . Osteoporosis   . Personal history of chemotherapy   . Personal history of radiation therapy     Past Surgical History:  Procedure Laterality Date  . BREAST LUMPECTOMY    . CHOLECYSTECTOMY    . MOUTH SURGERY  11/06/2013   2 implants put in  . PARTIAL  HYSTERECTOMY    . VESICOVAGINAL FISTULA CLOSURE W/ TAH      Social History   Socioeconomic History  . Marital status: Married    Spouse name: Not on file  . Number of children: 2  . Years of education: Not on file  . Highest education level: Not on file  Occupational History  . Occupation: Furniture conservator/restorer: [employer]  Social Needs  . Financial resource strain: Not on file  . Food insecurity:    Worry: Not on file    Inability: Not on file  . Transportation needs:    Medical: Not on file    Non-medical: Not on file  Tobacco Use  . Smoking status: Former Smoker    Types: Cigarettes    Last attempt to quit: 03/15/1998    Years since quitting: 19.8  . Smokeless tobacco: Never Used  Substance and Sexual Activity  . Alcohol use: Yes    Comment: not very often  . Drug use: No  . Sexual activity: Yes    Partners: Male    Birth control/protection: None  Lifestyle  . Physical activity:    Days per week: Not on file    Minutes per session: Not on file  . Stress: Not on file  Relationships  . Social connections:    Talks on phone:  Not on file    Gets together: Not on file    Attends religious service: Not on file    Active member of club or organization: Not on file    Attends meetings of clubs or organizations: Not on file    Relationship status: Not on file  Other Topics Concern  . Not on file  Social History Narrative  . Not on file     FAMILY HISTORY:  We obtained a detailed, 4-generation family history.  Significant diagnoses are listed below: Family History  Problem Relation Age of Onset  . Lung cancer Father        died at 17ys  . Diabetes type II Maternal Grandmother   . Heart disease Maternal Grandmother   . Uterine cancer Sister 84  . Rheum arthritis Maternal Aunt   . Kidney disease Maternal Aunt   . Heart disease Maternal Uncle   . Heart attack Maternal Grandfather   . Cancer Paternal Grandmother        NOS - died young   . Kidney disease Maternal Aunt   . Breast cancer Cousin 75       mat first cousin  . Breast cancer Cousin        mat first cousin d. 85  . Breast cancer Cousin        mat first cousin's daughter d. 75s  . Colon cancer Neg Hx   . Stomach cancer Neg Hx   . Esophageal cancer Neg Hx   . Rectal cancer Neg Hx     The patient has two daughters who are cancer free.  She has a full brother and sister, and two maternal half sisters.  Her full sister has uterine cancer that has spread to her lungs.  The patient's mother is living and her father is deceased.  The patient's father is an only child.  His mother died of an unknown cancer and died young.  There is no information on his father.  The patient's mother is living at 54.  She has two maternal half brothers and three maternal half sisters.  None had cancer, but two sisters each had a daughter with breast cancer, and one of these sisters had a granddaughter with breast cancer.  The maternal grandparents are deceased from non-cancer related issues.  The MGM's mother had breast cancer and died in her 6's.  [name] is unaware of previous family history of genetic testing for hereditary cancer risks. Patient's maternal ancestors are of African American descent, and paternal ancestors are of African American descent. There is no reported Ashkenazi Jewish ancestry. There is no known consanguinity.  GENETIC COUNSELING ASSESSMENT: Atlas Farha Dano is a 62 y.o. female with a personal and family history of breast cancer which is somewhat suggestive of a hereditary cancer syndrome and predisposition to cancer. We, therefore, discussed and recommended the following at today's visit.   DISCUSSION: We discussed that about 5-10% of breast cancer is hereditary with most cases due to BRCA mutations.  Other genes can play a part of hereditary breast cancer, including the ATM, CHEK2 and PALB2 genes.  We reviewed the family history, and it is more  consistent with a moderate risk gene mutation, however, most individuals that we test are negative.  We reviewed the characteristics, features and inheritance patterns of hereditary cancer syndromes. We also discussed genetic testing, including the appropriate family members to test, the process of testing, insurance coverage and turn-around-time for results. We discussed the implications of a  negative, positive and/or variant of uncertain significant result. We recommended Ms. Borrero pursue genetic testing for the CancerNext gene panel. The CancerNext gene panel +RNA insight offered by [employer] includes sequencing and rearrangement analysis for the following 34 genes:   APC, ATM, BARD1, BMPR1A, BRCA1, BRCA2, BRIP1, CDH1, CDK4, CDKN2A, CHEK2, DICER1, HOXB13, EPCAM, GREM1, MLH1, MRE11A, MSH2, MSH6, MUTYH, NBN, NF1, PALB2, PMS2, POLD1, POLE, PTEN, RAD50, RAD51C, RAD51D, SMAD4, SMARCA4, STK11, and TP53.     Based on Ms. Martinek's personal and family history of cancer, she meets medical criteria for genetic testing. Despite that she meets criteria, she may still have an out of pocket cost. We discussed that if her out of pocket cost for testing is over $100, the laboratory will call and confirm whether she wants to proceed with testing.  If the out of pocket cost of testing is less than $100 she will be billed by the genetic testing laboratory.   PLAN: After considering the risks, benefits, and limitations, Ms. Fentress  provided informed consent to pursue genetic testing and the blood sample was sent to [facility] for analysis of the [place]. Results should be available within approximately 2-3 weeks' time, at which point they will be disclosed by telephone to Ms. Dart, as will any additional recommendations warranted by these results. Ms. Eskenazi will receive a summary of her genetic counseling visit and a copy of her results once available. This information will  also be available in Epic. We encouraged Ms. Villeda to remain in contact with cancer genetics annually so that we can continuously update the family history and inform her of any changes in cancer genetics and testing that may be of benefit for her family. Ms. Sheeran questions were answered to her satisfaction today. Our contact information was provided should additional questions or concerns arise.  Lastly, we encouraged Ms. Louks to remain in contact with cancer genetics annually so that we can continuously update the family history and inform her of any changes in cancer genetics and testing that may be of benefit for this family.   Ms.  Hodgkin questions were answered to her satisfaction today. Our contact information was provided should additional questions or concerns arise. Thank you for the referral and allowing [place] to share in the care of your patient.   Karen P. Florene Glen, [place], [facility] Certified Genetic Counselor [email or web address] phone: [phone]  The patient was seen for a total of 45 minutes in face-to-face genetic counseling.  This patient was discussed with Drs. Magrinat, Lindi Adie and/or Burr Medico who agrees with the above.    _______________________________________________________________________ For Office Staff:  Number of people involved in session: 1 Was an Intern/ student involved with case: no

## 2018-02-14 ENCOUNTER — Ambulatory Visit: Payer: Self-pay | Admitting: Genetic Counselor

## 2018-02-14 ENCOUNTER — Encounter: Payer: Self-pay | Admitting: Genetic Counselor

## 2018-02-14 ENCOUNTER — Telehealth: Payer: Self-pay | Admitting: Genetic Counselor

## 2018-02-14 DIAGNOSIS — C50212 Malignant neoplasm of upper-inner quadrant of left female breast: Secondary | ICD-10-CM

## 2018-02-14 DIAGNOSIS — Z1379 Encounter for other screening for genetic and chromosomal anomalies: Secondary | ICD-10-CM | POA: Insufficient documentation

## 2018-02-14 NOTE — Telephone Encounter (Signed)
Revealed negative genetic testing.  Discussed that we do not know why she has breast cancer or why there is cancer in the family. It could be due to a different gene that we are not testing, or maybe our current technology may not be able to pick something up.  It will be important for her to keep in contact with genetics to keep up with whether additional testing may be needed. 

## 2018-02-14 NOTE — Progress Notes (Signed)
HPI:  Ms. Czaplicki was previously seen in the [facility] clinic due to a personal and family history of cancer and concerns regarding a hereditary predisposition to cancer. Please refer to our prior cancer genetics clinic note for more information regarding Ms. Meisenheimer's medical, social and family histories, and our assessment and recommendations, at the time. Ms. Silvestri recent genetic test results were disclosed to her, as were recommendations warranted by these results. These results and recommendations are discussed in more detail below.  CANCER HISTORY:    Breast cancer of upper-inner quadrant of left female breast ([place])   12/15/2012 Initial Diagnosis    Breast cancer of upper-inner quadrant of left female breast ([place])    02/08/2018 Genetic Testing    Negative genetic testing on the CancerNext+RNA test.  The CancerNext-Expanded gene panel offered by [facility] and includes sequencing and rearrangement analysis for the following 67 genes: AIP, ALK, APC*, ATM*, BAP1, BARD1, BLM, BMPR1A, BRCA1*, BRCA2*, BRIP1*, CDH1*, CDK4, CDKN1B, CDKN2A, CHEK2*, DICER1, FANCC, FH, FLCN, GALNT12, HOXB13, MAX, MEN1, MET, MLH1*, MRE11A, MSH2*, MSH6*, MUTYH*, NBN, NF1*, NF2, PALB2*, PHOX2B, PMS2*, POLD1, POLE, POT1, PRKAR1A, PTCH1, PTEN*, RAD50, RAD51C*, RAD51D*, RB1, RET, SDHA, SDHAF2, SDHB, SDHC, SDHD, SMAD4, SMARCA4, SMARCB1, SMARCE1, STK11, SUFU, TMEM127, TP53*, TSC1, TSC2, VHL and XRCC2 (sequencing and deletion/duplication); MITF (sequencing only); EPCAM and GREM1 (deletion/duplication only). DNA and RNA analyses performed for * genes. The report date is February 08, 2018.     FAMILY HISTORY:  We obtained a detailed, 4-generation family history.  Significant diagnoses are listed below: Family History  Problem Relation Age of Onset  . Lung cancer Father        died at 41ys  . Diabetes type II Maternal Grandmother   . Heart disease Maternal Grandmother   . Uterine cancer Sister  77  . Rheum arthritis Maternal Aunt   . Kidney disease Maternal Aunt   . Heart disease Maternal Uncle   . Heart attack Maternal Grandfather   . Cancer Paternal Grandmother        NOS - died young  . Kidney disease Maternal Aunt   . Breast cancer Cousin 50       mat first cousin  . Breast cancer Cousin        mat first cousin d. 11  . Breast cancer Cousin        mat first cousin's daughter d. 39s  . Colon cancer Neg Hx   . Stomach cancer Neg Hx   . Esophageal cancer Neg Hx   . Rectal cancer Neg Hx     The patient has two daughters who are cancer free.  She has a full brother and sister, and two maternal half sisters.  Her full sister has uterine cancer that has spread to her lungs.  The patient's mother is living and her father is deceased.  The patient's father is an only child.  His mother died of an unknown cancer and died young.  There is no information on his father.  The patient's mother is living at 81.  She has two maternal half brothers and three maternal half sisters.  None had cancer, but two sisters each had a daughter with breast cancer, and one of these sisters had a granddaughter with breast cancer.  The maternal grandparents are deceased from non-cancer related issues.  The MGM's mother had breast cancer and died in her 88's.  Ms. Demuro is unaware of previous family history of genetic testing for hereditary cancer risks.  Patient's maternal ancestors are of African American descent, and paternal ancestors are of African American descent. There is no reported Ashkenazi Jewish ancestry. There is no known consanguinity.  GENETIC TEST RESULTS: Genetic testing reported out on February 08, 2018 through the CancerNext+RNAinsight cancer panel found no deleterious mutations.  The CancerNext gene panel offered by [facility] includes sequencing and rearrangement analysis for the following 34 genes:   APC, ATM, BARD1, BMPR1A, BRCA1, BRCA2, BRIP1, CDH1, CDK4, CDKN2A, CHEK2,  DICER1, HOXB13, EPCAM, GREM1, MLH1, MRE11A, MSH2, MSH6, MUTYH, NBN, NF1, PALB2, PMS2, POLD1, POLE, PTEN, RAD50, RAD51C, RAD51D, SMAD4, SMARCA4, STK11, and TP53.    The test report has been scanned into EPIC and is located under the Molecular Pathology section of the Results Review tab.    We discussed with Ms. Hush that since the current genetic testing is not perfect, it is possible there may be a gene mutation in one of these genes that current testing cannot detect, but that chance is small.  We also discussed, that it is possible that another gene that has not yet been discovered, or that we have not yet tested, is responsible for the cancer diagnoses in the family, and it is, therefore, important to remain in touch with cancer genetics in the future so that we can continue to offer Ms. Eschbach the most up to date genetic testing.   CANCER SCREENING RECOMMENDATIONS: This result is reassuring and indicates that Ms. Cabacungan likely does not have an increased risk for a future cancer due to a mutation in one of these genes. This normal test also suggests that Ms. Dinius's cancer was most likely not due to an inherited predisposition associated with one of these genes.  Most cancers happen by chance and this negative test suggests that her cancer falls into this category.  We, therefore, recommended she continue to follow the cancer management and screening guidelines provided by her oncology and primary healthcare provider.   An individual's cancer risk and medical management are not determined by genetic test results alone. Overall cancer risk assessment incorporates additional factors, including personal medical history, family history, and any available genetic information that may result in a personalized plan for cancer prevention and surveillance.  RECOMMENDATIONS FOR FAMILY MEMBERS:  Women in this family might be at some increased risk of developing cancer, over the general population  risk, simply due to the family history of cancer.  We recommended women in this family have a yearly mammogram beginning at age 73, or 41 years younger than the earliest onset of cancer, an annual clinical breast exam, and perform monthly breast self-exams. Women in this family should also have a gynecological exam as recommended by their primary provider. All family members should have a colonoscopy by age 46.  FOLLOW-UP: Lastly, we discussed with Ms. Rister that cancer genetics is a rapidly advancing field and it is possible that new genetic tests will be appropriate for her and/or her family members in the future. We encouraged her to remain in contact with cancer genetics on an annual basis so we can update her personal and family histories and let her know of advances in cancer genetics that may benefit this family.   Our contact number was provided. Ms. Gadsby questions were answered to her satisfaction, and she knows she is welcome to call us at anytime with additional questions or concerns.   Roma Kayser, MS, [facility] Certified Genetic Counselor [email or web address]

## 2018-06-09 ENCOUNTER — Other Ambulatory Visit: Payer: Self-pay | Admitting: Internal Medicine

## 2018-11-28 ENCOUNTER — Other Ambulatory Visit: Payer: Self-pay | Admitting: Internal Medicine

## 2018-11-28 ENCOUNTER — Other Ambulatory Visit: Payer: Self-pay | Admitting: General Practice

## 2018-11-28 DIAGNOSIS — N6489 Other specified disorders of breast: Secondary | ICD-10-CM

## 2018-11-28 DIAGNOSIS — N644 Mastodynia: Secondary | ICD-10-CM

## 2018-12-04 ENCOUNTER — Ambulatory Visit: Payer: Managed Care, Other (non HMO)

## 2018-12-04 ENCOUNTER — Ambulatory Visit
Admission: RE | Admit: 2018-12-04 | Discharge: 2018-12-04 | Disposition: A | Discharge status: Home / Self Care | Payer: Managed Care, Other (non HMO) | Source: Ambulatory Visit | Attending: General Practice | Admitting: General Practice

## 2018-12-04 ENCOUNTER — Other Ambulatory Visit: Payer: Self-pay

## 2018-12-04 DIAGNOSIS — N6489 Other specified disorders of breast: Secondary | ICD-10-CM

## 2018-12-04 DIAGNOSIS — N644 Mastodynia: Secondary | ICD-10-CM

## 2018-12-26 ENCOUNTER — Other Ambulatory Visit: Payer: Self-pay | Admitting: General Practice

## 2018-12-26 DIAGNOSIS — Z1231 Encounter for screening mammogram for malignant neoplasm of breast: Secondary | ICD-10-CM

## 2019-02-12 ENCOUNTER — Other Ambulatory Visit: Payer: Self-pay

## 2019-02-12 ENCOUNTER — Ambulatory Visit
Admission: RE | Admit: 2019-02-12 | Discharge: 2019-02-12 | Disposition: A | Discharge status: Home / Self Care | Payer: Managed Care, Other (non HMO) | Source: Ambulatory Visit | Attending: General Practice | Admitting: General Practice

## 2019-02-12 DIAGNOSIS — Z1231 Encounter for screening mammogram for malignant neoplasm of breast: Secondary | ICD-10-CM

## 2019-05-21 ENCOUNTER — Other Ambulatory Visit: Payer: Self-pay | Admitting: Internal Medicine

## 2019-05-31 ENCOUNTER — Ambulatory Visit (INDEPENDENT_AMBULATORY_CARE_PROVIDER_SITE_OTHER): Payer: Managed Care, Other (non HMO) | Admitting: Physician Assistant

## 2019-05-31 ENCOUNTER — Encounter: Payer: Self-pay | Admitting: Physician Assistant

## 2019-05-31 DIAGNOSIS — K449 Diaphragmatic hernia without obstruction or gangrene: Secondary | ICD-10-CM | POA: Diagnosis not present

## 2019-05-31 DIAGNOSIS — K219 Gastro-esophageal reflux disease without esophagitis: Secondary | ICD-10-CM | POA: Diagnosis not present

## 2019-05-31 MED ORDER — OMEPRAZOLE 20 MG PO CPDR: 20.0000 mg | DELAYED_RELEASE_CAPSULE | Freq: Every morning | ORAL | 3 refills | Status: DC

## 2019-05-31 NOTE — Patient Instructions (Signed)
We have sent the following medications to your pharmacy for you to pick up at your convenience: Omeprazole 20 mg every morning.  Please follow up in office in 1 year.  If you are age 64 or older, your body mass index should be between 23-30. Your There is no height or weight on file to calculate BMI. If this is out of the aforementioned range listed, please consider follow up with your Primary Care Provider.  If you are age 16 or younger, your body mass index should be between 19-25. Your There is no height or weight on file to calculate BMI. If this is out of the aformentioned range listed, please consider follow up with your Primary Care Provider.

## 2019-05-31 NOTE — Progress Notes (Signed)
Assessment and plan reviewed 

## 2019-05-31 NOTE — Progress Notes (Signed)
Subjective:    Patient ID: Ana Wilson, female    DOB: 06-Aug-1955, 64 y.o.   MRN: [id number] This service was provided via telemedicine.  Telephone/video call. The patient was located at home. The provider was located in provider's GI office. The patient did consent to this telephone visit and is aware of possible charges with her insurance for this visit. To persons participating in this telemedicine service were the patient and I. Time spent on call;6 minutes HPI Ana Wilson is a pleasant 64 year old female, established with Dr. Henrene Pastor with history of chronic GERD, IBS and constipation.  She has history of breast cancer and is status post cholecystectomy. This is a routine visit for med refills.  Patient has been maintained on omeprazole 20 mg p.o. every morning for chronic GERD and says this controls her symptoms very well.  She had EGD in 2013 which was normal. She has no complaints of dysphagia or odynophagia, generally no nocturnal symptoms.  No complaints of abdominal pain, changes in bowel habits melena or hematochezia. She had screening colonoscopy in June 2018 which was normal and was indicated for 10-year interval follow-up.  She has not been having any significant problems with constipation recently, has been taking a probiotic which is helpful and uses MiraLAX as needed.  Review of Systems Pertinent positive and negative review of systems were noted in the above HPI section.  All other review of systems was otherwise negative.  Outpatient Encounter Medications as of 05/31/2019  Medication Sig  . carvedilol (COREG) 6.25 MG tablet Take 6.25 mg by mouth 2 (two) times daily.  . Diphenhydramine-PE-APAP (ALLERGY RELIEF PLUS SINUS PO) Take 1 tablet by mouth daily.  . Multiple Vitamin (MULTIVITAMIN) tablet Take 1 tablet by mouth daily.    Marland Kitchen omeprazole (PRILOSEC) 20 MG capsule Take 1 capsule (20 mg total) by mouth in the morning. Patient needs office visit for further refills  .  Probiotic Product (PROBIOTIC DAILY PO) Take by mouth.  . [DISCONTINUED] omeprazole (PRILOSEC) 20 MG capsule Take 1 capsule (20 mg total) by mouth daily. Patient needs office visit for further refills  . [DISCONTINUED] alendronate (FOSAMAX) 70 MG tablet Take 70 mg by mouth once a week. Take with a full glass of water on an empty stomach.  . [DISCONTINUED] bisacodyl (DULCOLAX) 5 MG EC tablet Take 5 mg by mouth daily as needed for moderate constipation.  . [DISCONTINUED] Calcium Carbonate-Vitamin D (CALCIUM + D PO) Take 1,500 mg by mouth daily. Pt taking OTC calcium +D, 1500 mg daily  . [DISCONTINUED] hyoscyamine (LEVSIN SL) 0.125 MG SL tablet Place 1 tablet (0.125 mg total) under the tongue every 6 (six) hours as needed.  . [DISCONTINUED] polyethylene glycol powder (MIRALAX) powder Take 17 g by mouth 2 (two) times daily.  . [DISCONTINUED] 0.9 %  sodium chloride infusion    No facility-administered encounter medications on file as of 05/31/2019.   Allergies  Allergen Reactions  . Cefdinir Diarrhea  . Aspirin   . Penicillins Hives  . Sulfa Antibiotics Rash   Patient Active Problem List   Diagnosis Date Noted  . Genetic testing 02/14/2018  . History of breast cancer 01/23/2018  . Family history of breast cancer   . Upper abdominal pain 07/21/2016  . Constipation 12/14/2013  . IBS (irritable bowel syndrome) 12/14/2013  . 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 Gallstones 11/12/2010  . Kidney stones 11/12/2010  . Lung nodules 11/11/2010   Social History   Socioeconomic  History  . Marital status: Married    Spouse name: Not on file  . Number of children: 2  . Years of education: Not on file  . Highest education level: Not on file  Occupational History  . Occupation: Furniture conservator/restorer: [employer]  Tobacco Use  . Smoking status: Former Smoker    Types: Cigarettes    Quit date: 03/15/1998    Years since quitting: 21.2   . Smokeless tobacco: Never Used  Substance and Sexual Activity  . Alcohol use: Yes    Comment: not very often  . Drug use: No  . Sexual activity: Yes    Partners: Male    Birth control/protection: None  Other Topics Concern  . Not on file  Social History Narrative  . Not on file   Social Determinants of Health   Financial Resource Strain:   . Difficulty of Paying Living Expenses:   Food Insecurity:   . Worried About Charity fundraiser in the Last Year:   . Arboriculturist in the Last Year:   Transportation Needs:   . Film/video editor (Medical):   Marland Kitchen Lack of Transportation (Non-Medical):   Physical Activity:   . Days of Exercise per Week:   . Minutes of Exercise per Session:   Stress:   . Feeling of Stress :   Social Connections:   . Frequency of Communication with Friends and Family:   . Frequency of Social Gatherings with Friends and Family:   . Attends Religious Services:   . Active Member of Clubs or Organizations:   . Attends Archivist Meetings:   Marland Kitchen Marital Status:   Intimate Partner Violence:   . Fear of Current or Ex-Partner:   . Emotionally Abused:   Marland Kitchen Physically Abused:   . Sexually Abused:     Ms. Cressey family history includes Breast cancer in her cousin and cousin; Breast cancer (age of onset: 26) in her cousin; Cancer in her paternal grandmother; Diabetes type II in her maternal grandmother; Heart attack in her maternal grandfather; Heart disease in her maternal grandmother and maternal uncle; Kidney disease in her maternal aunt and maternal aunt; Lung cancer in her father; Rheum arthritis in her maternal aunt; Uterine cancer (age of onset: 8) in her sister.      Objective:    There were no vitals filed for this visit.  Physical Exam no exam, this was a virtual/phone visit       Assessment & Plan:   #96 64 year old female with chronic GERD, well controlled on omeprazole 20 mg p.o. daily #2 colon cancer  surveillance-up-to-date with negative colonoscopy June 2018 #3 constipation-mild, using probiotic and as needed MiraLAX  Plan; continue antireflux regimen. Refill omeprazole 20 mg p.o. every morning AC breakfast x1 year Patient will follow up in 1 year with Dr. Henrene Pastor or myself, or sooner as needed   Ana Wilson Genia Harold PA-C 05/31/2019   Cc: Maggie Schwalbe, PA-C

## 2019-12-24 ENCOUNTER — Other Ambulatory Visit: Payer: Self-pay | Admitting: General Practice

## 2019-12-24 DIAGNOSIS — Z1231 Encounter for screening mammogram for malignant neoplasm of breast: Secondary | ICD-10-CM

## 2020-02-13 ENCOUNTER — Ambulatory Visit: Payer: Managed Care, Other (non HMO)

## 2020-03-20 ENCOUNTER — Ambulatory Visit: Payer: Managed Care, Other (non HMO)

## 2020-03-28 ENCOUNTER — Other Ambulatory Visit: Payer: Self-pay | Admitting: Family Medicine

## 2020-03-28 DIAGNOSIS — E2839 Other primary ovarian failure: Secondary | ICD-10-CM

## 2020-04-02 ENCOUNTER — Other Ambulatory Visit: Payer: Self-pay

## 2020-04-02 ENCOUNTER — Ambulatory Visit: Payer: Managed Care, Other (non HMO)

## 2020-04-02 ENCOUNTER — Ambulatory Visit
Admission: RE | Admit: 2020-04-02 | Discharge: 2020-04-02 | Disposition: A | Discharge status: Home / Self Care | Payer: Managed Care, Other (non HMO) | Source: Ambulatory Visit | Attending: General Practice | Admitting: General Practice

## 2020-04-02 DIAGNOSIS — Z1231 Encounter for screening mammogram for malignant neoplasm of breast: Secondary | ICD-10-CM

## 2020-04-03 ENCOUNTER — Other Ambulatory Visit: Payer: Self-pay | Admitting: Physician Assistant

## 2020-04-03 ENCOUNTER — Other Ambulatory Visit: Payer: Self-pay | Admitting: Family Medicine

## 2020-04-03 ENCOUNTER — Other Ambulatory Visit: Payer: Self-pay | Admitting: General Practice

## 2020-04-03 DIAGNOSIS — E2839 Other primary ovarian failure: Secondary | ICD-10-CM

## 2020-04-04 ENCOUNTER — Other Ambulatory Visit: Payer: Self-pay

## 2020-04-04 ENCOUNTER — Ambulatory Visit
Admission: RE | Admit: 2020-04-04 | Discharge: 2020-04-04 | Disposition: A | Discharge status: Home / Self Care | Payer: Managed Care, Other (non HMO) | Source: Ambulatory Visit | Attending: Family Medicine | Admitting: Family Medicine

## 2020-04-04 DIAGNOSIS — E2839 Other primary ovarian failure: Secondary | ICD-10-CM

## 2020-05-26 ENCOUNTER — Other Ambulatory Visit: Payer: Self-pay | Admitting: Physician Assistant

## 2020-05-28 ENCOUNTER — Other Ambulatory Visit: Payer: Self-pay | Admitting: Obstetrics and Gynecology

## 2020-05-28 DIAGNOSIS — N63 Unspecified lump in unspecified breast: Secondary | ICD-10-CM

## 2020-06-30 ENCOUNTER — Ambulatory Visit
Admission: RE | Admit: 2020-06-30 | Discharge: 2020-06-30 | Disposition: A | Discharge status: Home / Self Care | Payer: Managed Care, Other (non HMO) | Source: Ambulatory Visit | Attending: Obstetrics and Gynecology | Admitting: Obstetrics and Gynecology

## 2020-06-30 ENCOUNTER — Other Ambulatory Visit: Payer: Self-pay | Admitting: Obstetrics and Gynecology

## 2020-06-30 ENCOUNTER — Other Ambulatory Visit: Payer: Self-pay

## 2020-06-30 DIAGNOSIS — N63 Unspecified lump in unspecified breast: Secondary | ICD-10-CM

## 2020-07-04 ENCOUNTER — Ambulatory Visit
Admission: RE | Admit: 2020-07-04 | Discharge: 2020-07-04 | Disposition: A | Discharge status: Home / Self Care | Payer: Managed Care, Other (non HMO) | Source: Ambulatory Visit | Attending: Obstetrics and Gynecology | Admitting: Obstetrics and Gynecology

## 2020-07-04 ENCOUNTER — Other Ambulatory Visit: Payer: Self-pay

## 2020-07-04 DIAGNOSIS — N63 Unspecified lump in unspecified breast: Secondary | ICD-10-CM

## 2020-07-04 HISTORY — PX: BREAST BIOPSY: SHX20

## 2020-07-05 ENCOUNTER — Other Ambulatory Visit: Payer: Self-pay | Admitting: Physician Assistant

## 2020-07-21 ENCOUNTER — Other Ambulatory Visit: Payer: Managed Care, Other (non HMO)

## 2020-12-01 ENCOUNTER — Other Ambulatory Visit: Payer: Self-pay | Admitting: General Practice

## 2020-12-01 DIAGNOSIS — N6311 Unspecified lump in the right breast, upper outer quadrant: Secondary | ICD-10-CM

## 2020-12-25 ENCOUNTER — Ambulatory Visit
Admission: RE | Admit: 2020-12-25 | Discharge: 2020-12-25 | Disposition: A | Discharge status: Home / Self Care | Payer: Managed Care, Other (non HMO) | Source: Ambulatory Visit | Attending: General Practice | Admitting: General Practice

## 2020-12-25 ENCOUNTER — Other Ambulatory Visit: Payer: Self-pay | Admitting: General Practice

## 2020-12-25 ENCOUNTER — Ambulatory Visit
Admission: RE | Admit: 2020-12-25 | Discharge: 2020-12-25 | Disposition: A | Discharge status: Home / Self Care | Payer: Medicare Other | Source: Ambulatory Visit | Attending: General Practice | Admitting: General Practice

## 2020-12-25 ENCOUNTER — Other Ambulatory Visit: Payer: Self-pay

## 2020-12-25 DIAGNOSIS — Z1231 Encounter for screening mammogram for malignant neoplasm of breast: Secondary | ICD-10-CM

## 2020-12-25 DIAGNOSIS — N6311 Unspecified lump in the right breast, upper outer quadrant: Secondary | ICD-10-CM

## 2021-04-03 ENCOUNTER — Ambulatory Visit
Admission: RE | Admit: 2021-04-03 | Discharge: 2021-04-03 | Disposition: A | Discharge status: Home / Self Care | Payer: Medicare Other | Source: Ambulatory Visit | Attending: General Practice | Admitting: General Practice

## 2021-04-03 DIAGNOSIS — Z1231 Encounter for screening mammogram for malignant neoplasm of breast: Secondary | ICD-10-CM

## 2021-12-28 ENCOUNTER — Other Ambulatory Visit: Payer: Self-pay | Admitting: General Practice

## 2021-12-28 DIAGNOSIS — Z1231 Encounter for screening mammogram for malignant neoplasm of breast: Secondary | ICD-10-CM

## 2022-04-05 ENCOUNTER — Ambulatory Visit
Admission: RE | Admit: 2022-04-05 | Discharge: 2022-04-05 | Disposition: A | Discharge status: Home / Self Care | Payer: Medicare Other | Source: Ambulatory Visit | Attending: General Practice | Admitting: General Practice

## 2022-04-05 DIAGNOSIS — Z1231 Encounter for screening mammogram for malignant neoplasm of breast: Secondary | ICD-10-CM

## 2022-06-21 IMAGING — MG MM DIGITAL DIAGNOSTIC UNILAT*L* W/ TOMO W/ CAD
8 series · 8 of 20 positions shown · non-contrast
Comparison: Previous exam(s).

CLINICAL DATA: 64-year-old female presenting with a new palpable
area of concern in the left breast. Patient reports a pea-sized
lump. History of left breast cancer in 0931 status post lumpectomy
and radiation.

EXAM:
DIGITAL DIAGNOSTIC UNILATERAL LEFT MAMMOGRAM WITH TOMOSYNTHESIS AND
CAD; ULTRASOUND LEFT BREAST LIMITED
TECHNIQUE: Left digital diagnostic mammography and breast tomosynthesis was
performed. The images were evaluated with computer-aided detection.;
Targeted ultrasound examination of the left breast was performed

[L ML]
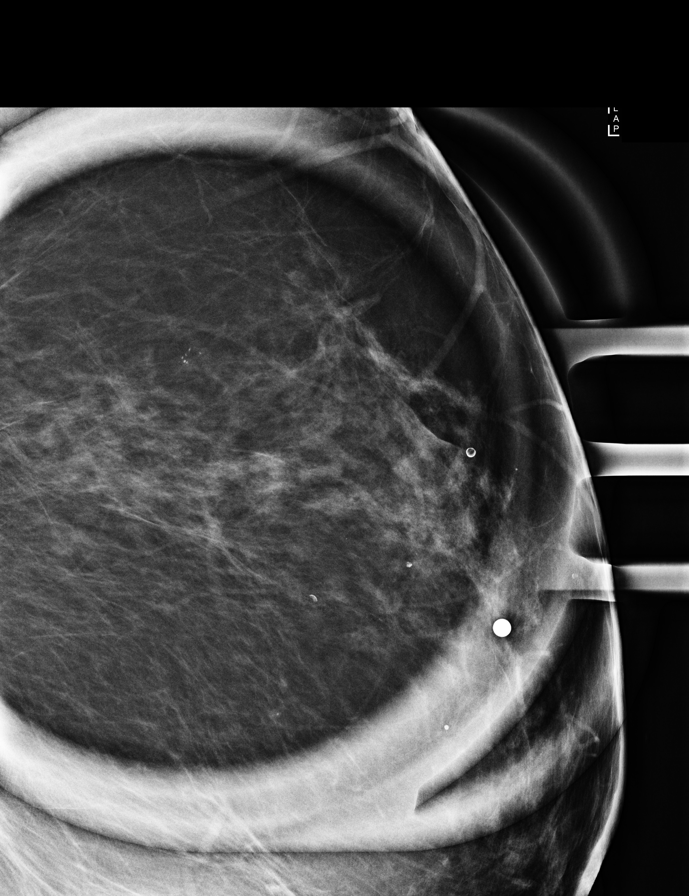

[L CC]
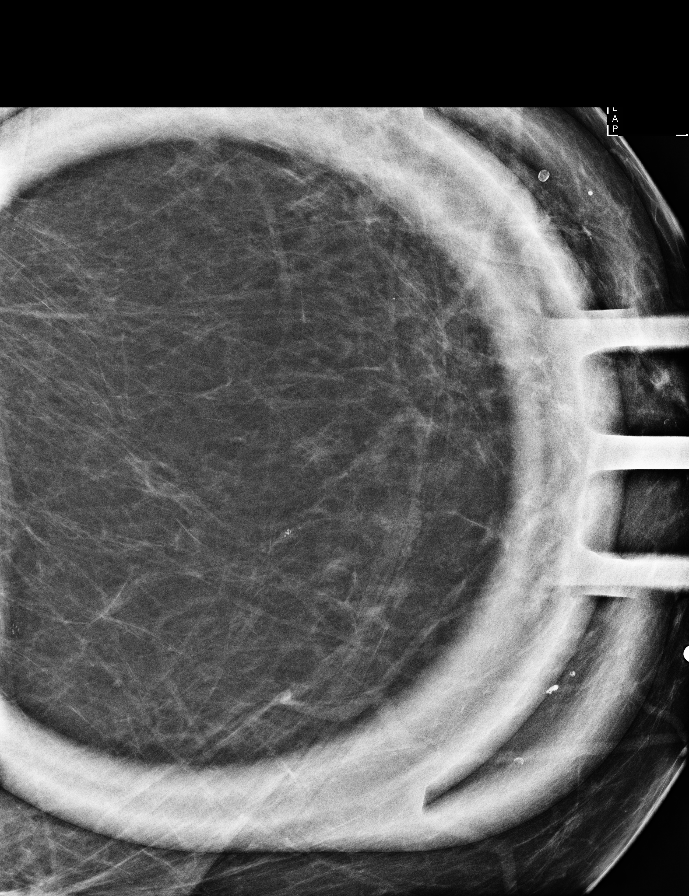

[L CC synth-2D (1 of 2)]
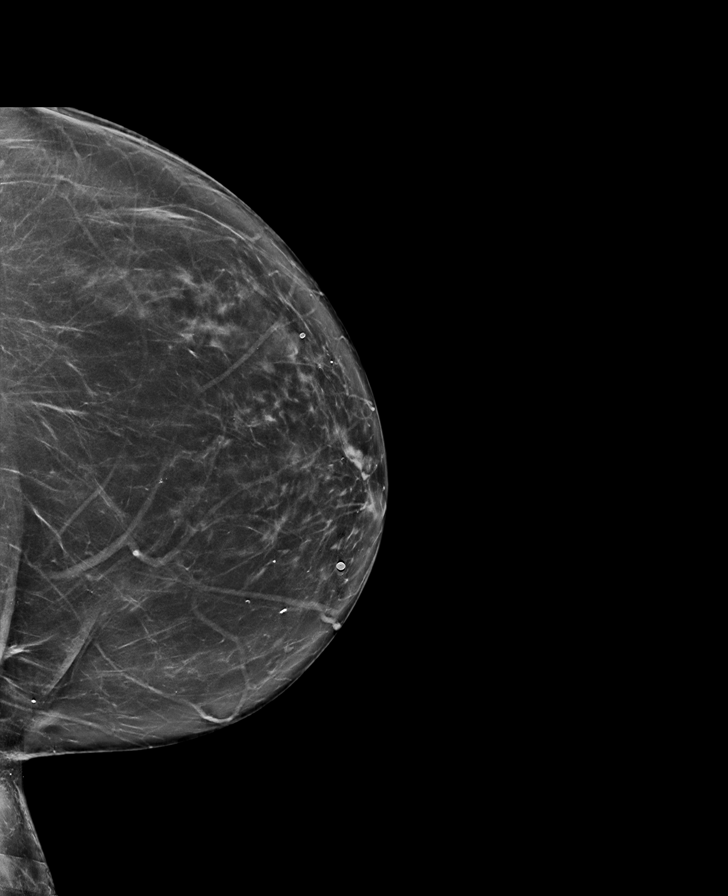

[L CC synth-2D (2 of 2)]
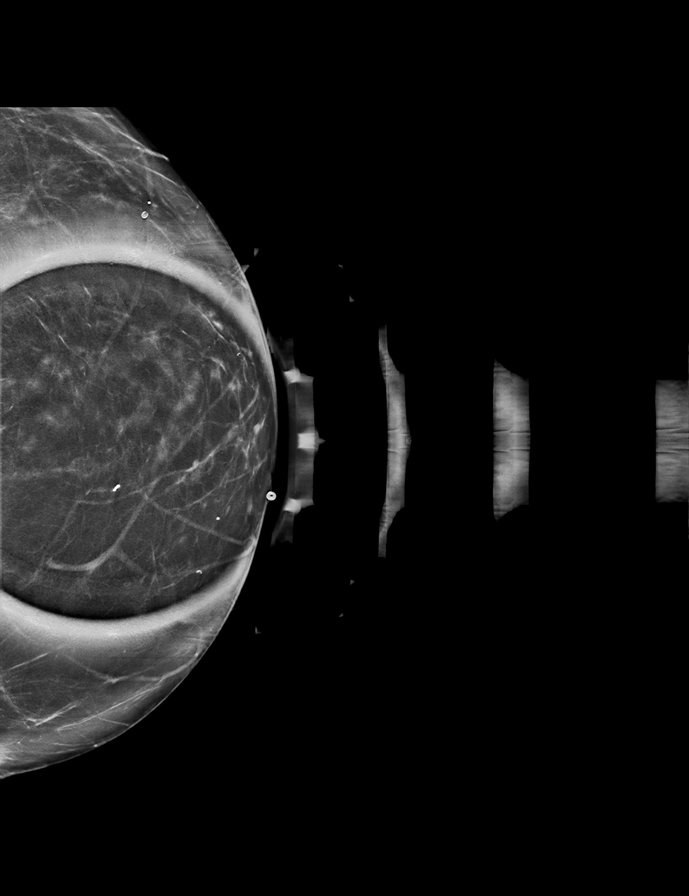

[L MLO synth-2D]
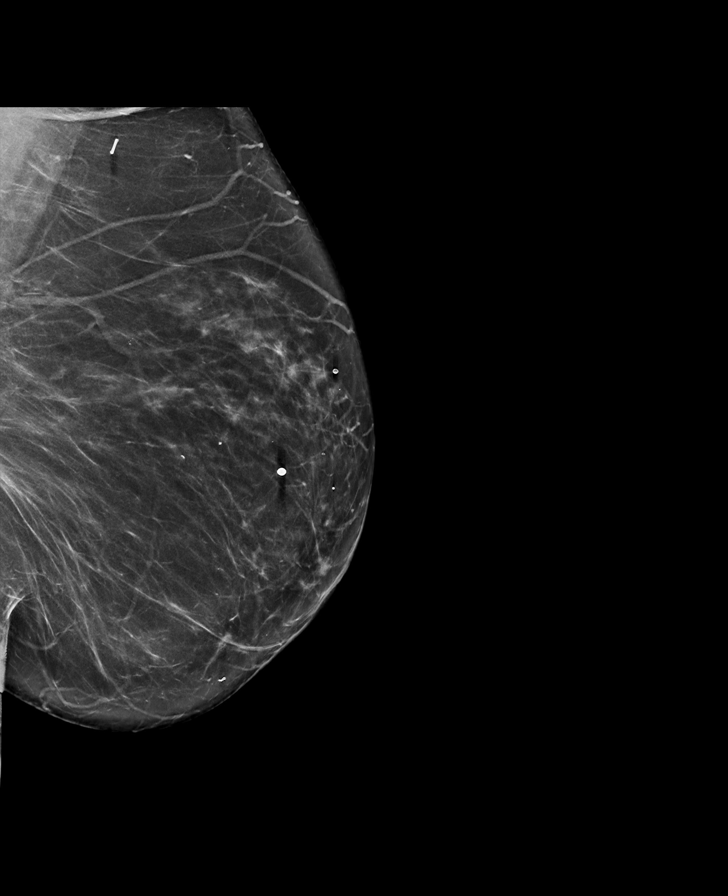

[L MLO tomo · tomo slice 39/78.0]
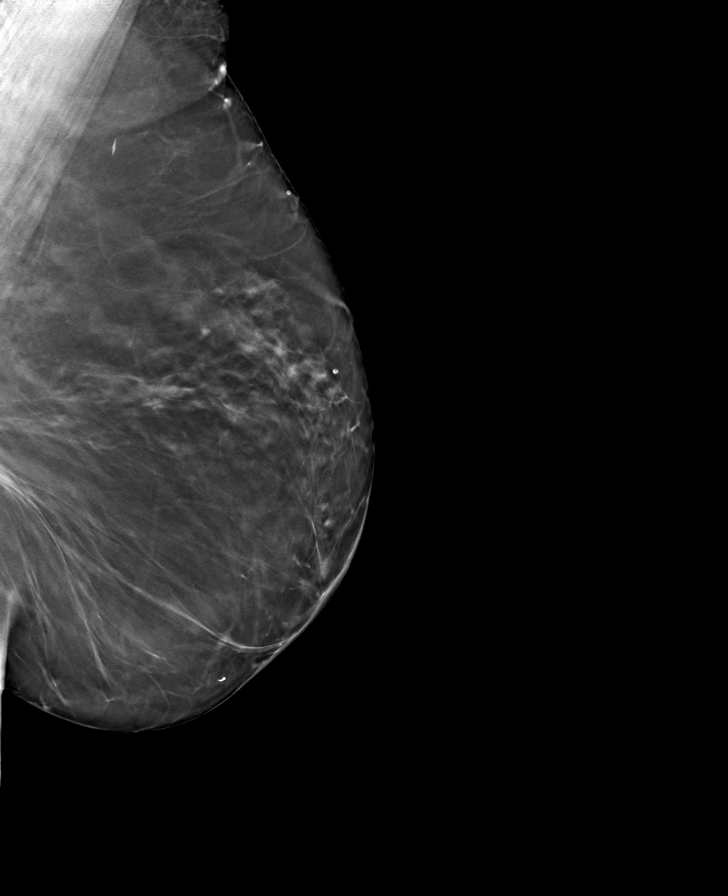

[L CC tomo (1 of 2) · tomo slice 35/69.0]
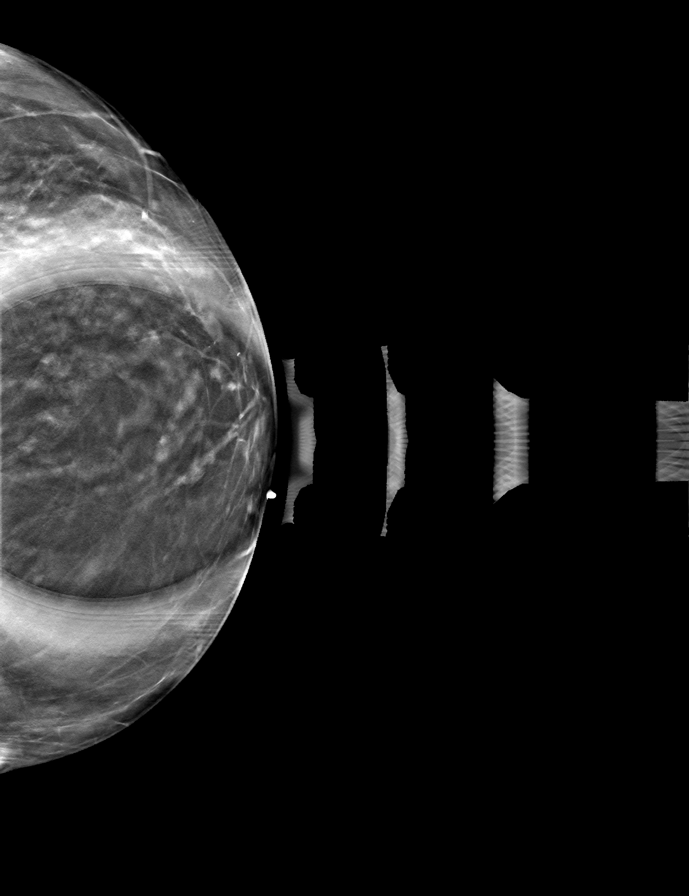

[L CC tomo (2 of 2) · tomo slice 39/76.0]
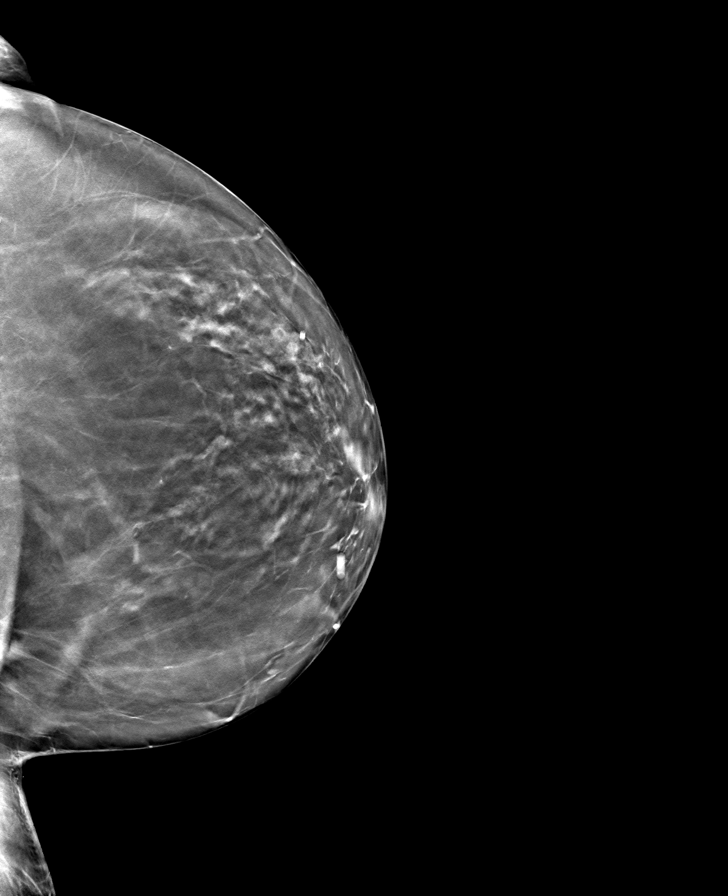

[8 of 20 positions shown; findings below may reference images not displayed]

ACR Breast Density Category b: There are scattered areas of
fibroglandular density.
FINDINGS: Mammogram:

Full field and spot-compression tomosynthesis as well as 2D spot
magnification views of the left breast were performed. A skin BB
marks the palpable site of concern reported by the patient in the
upper slightly inner aspect of the left breast. On the spot
tangential view there is a possible superficial mass measuring 3 mm.
Additionally in the superior central left breast there is a group of
punctate calcifications measuring 3 mm.

On physical exam at the site of concern reported by the patient in
the upper slightly inner left breast I palpate a small superficial
mass.

Ultrasound:

Targeted ultrasound performed at the site of concern reported by the
patient in the left breast at 10 o'clock 1 cm from the nipple
demonstrating an oval circumscribed hypoechoic mass just beneath the
skin measuring 3 x 3 x 3 mm. No internal vascularity. Targeted
ultrasound of the left axilla demonstrates normal lymph nodes.
IMPRESSION: 1. Indeterminate 3 mm mass at the site of concern reported by the
patient in the left breast at 10 o'clock.

2. Indeterminate calcifications spanning 3 mm in the superior
central left breast.

RECOMMENDATION:
1. Ultrasound-guided core needle biopsy of the 3 mm mass in the left
breast at 10 o'clock.

2. Stereotactic core needle biopsy of the calcifications in the
superior central left breast.

I have discussed the findings and recommendations with the patient.
If applicable, a reminder letter will be sent to the patient
regarding the next appointment.

BI-RADS CATEGORY  4: Suspicious.

## 2023-01-13 ENCOUNTER — Other Ambulatory Visit: Payer: Self-pay | Admitting: Obstetrics and Gynecology

## 2023-01-13 DIAGNOSIS — N644 Mastodynia: Secondary | ICD-10-CM

## 2023-01-25 ENCOUNTER — Ambulatory Visit: Payer: Medicare Other

## 2023-01-25 ENCOUNTER — Ambulatory Visit
Admission: RE | Admit: 2023-01-25 | Discharge: 2023-01-25 | Disposition: A | Discharge status: Home / Self Care | Payer: Medicare Other | Source: Ambulatory Visit | Attending: Obstetrics and Gynecology | Admitting: Obstetrics and Gynecology

## 2023-01-25 ENCOUNTER — Other Ambulatory Visit: Payer: Self-pay | Admitting: Obstetrics and Gynecology

## 2023-01-25 DIAGNOSIS — N644 Mastodynia: Secondary | ICD-10-CM

## 2023-01-25 DIAGNOSIS — Z1231 Encounter for screening mammogram for malignant neoplasm of breast: Secondary | ICD-10-CM

## 2023-04-11 ENCOUNTER — Ambulatory Visit
Admission: RE | Admit: 2023-04-11 | Discharge: 2023-04-11 | Disposition: A | Discharge status: Home / Self Care | Payer: Medicare Other | Source: Ambulatory Visit | Attending: Obstetrics and Gynecology | Admitting: Obstetrics and Gynecology

## 2023-04-11 DIAGNOSIS — Z1231 Encounter for screening mammogram for malignant neoplasm of breast: Secondary | ICD-10-CM

## 2023-06-06 ENCOUNTER — Encounter (HOSPITAL_BASED_OUTPATIENT_CLINIC_OR_DEPARTMENT_OTHER): Payer: Self-pay

## 2023-06-06 ENCOUNTER — Other Ambulatory Visit (HOSPITAL_BASED_OUTPATIENT_CLINIC_OR_DEPARTMENT_OTHER): Payer: Self-pay

## 2023-06-06 ENCOUNTER — Ambulatory Visit (HOSPITAL_BASED_OUTPATIENT_CLINIC_OR_DEPARTMENT_OTHER)
Admission: RE | Admit: 2023-06-06 | Discharge: 2023-06-06 | Disposition: A | Discharge status: Home / Self Care | Source: Ambulatory Visit

## 2023-06-06 VITALS — BP 118/78 | HR 93 | Temp 98.3°F | Resp 20

## 2023-06-06 DIAGNOSIS — R051 Acute cough: Secondary | ICD-10-CM

## 2023-06-06 HISTORY — DX: Essential (primary) hypertension: I10

## 2023-06-06 HISTORY — DX: Acute combined systolic (congestive) and diastolic (congestive) heart failure: I50.41

## 2023-06-06 MED ORDER — AZITHROMYCIN 250 MG PO TABS
ORAL_TABLET | ORAL | 0 refills | Status: AC
  Filled 2023-06-06: qty 6, 5d supply, fill #0

## 2023-06-06 MED ORDER — ALBUTEROL SULFATE HFA 108 (90 BASE) MCG/ACT IN AERS
1.0000 | INHALATION_SPRAY | Freq: Four times a day (QID) | RESPIRATORY_TRACT | 0 refills | Status: AC | PRN
  Filled 2023-06-06: qty 6.7, 17d supply, fill #0

## 2023-06-06 MED ORDER — PROMETHAZINE-DM 6.25-15 MG/5ML PO SYRP
5.0000 mL | ORAL_SOLUTION | Freq: Two times a day (BID) | ORAL | 0 refills | Status: DC | PRN
  Filled 2023-06-06: qty 118, 12d supply, fill #0

## 2023-06-06 NOTE — ED Triage Notes (Signed)
 Hx of sarcoidosis and began experiencing cough, nasal congestion since then. Using Mucinex daily with little improvement. Does have a nebulizer but no medication for machine.

## 2023-06-06 NOTE — Discharge Instructions (Signed)
 Treating you for upper respiratory syndrome. Take the antibiotics as prescribed. Cough syrup as needed.  Can do Mucinex during the day. Albuterol inhaler as needed for cough,  wheezing, SOB.  Follow up with your doctor as needed

## 2023-06-10 ENCOUNTER — Other Ambulatory Visit (HOSPITAL_BASED_OUTPATIENT_CLINIC_OR_DEPARTMENT_OTHER): Payer: Self-pay

## 2023-06-10 ENCOUNTER — Ambulatory Visit (HOSPITAL_BASED_OUTPATIENT_CLINIC_OR_DEPARTMENT_OTHER)
Admission: EM | Admit: 2023-06-10 | Discharge: 2023-06-10 | Disposition: A | Discharge status: Home / Self Care | Attending: Family Medicine | Admitting: Family Medicine

## 2023-06-10 ENCOUNTER — Encounter (HOSPITAL_BASED_OUTPATIENT_CLINIC_OR_DEPARTMENT_OTHER): Payer: Self-pay

## 2023-06-10 DIAGNOSIS — R051 Acute cough: Secondary | ICD-10-CM

## 2023-06-10 MED ORDER — PREDNISONE 20 MG PO TABS
40.0000 mg | ORAL_TABLET | Freq: Every day | ORAL | 0 refills | Status: AC
  Filled 2023-06-10: qty 10, 5d supply, fill #0

## 2023-06-10 NOTE — Discharge Instructions (Addendum)
 Take the prednisone as.  You can take the cough medicine that was previously prescribed every 4-6 hours. Rest, hydrate and follow-up as needed

## 2023-06-10 NOTE — ED Provider Notes (Signed)
 [facility]    CSN: [id number] Arrival date & time: 06/10/23  1005      History   Chief Complaint Chief Complaint  Patient presents with   Cough    HPI Ana Wilson is a 68 y.o. female.   Patient is a 68 year old female that presents today with continued cough and congestion.  Was seen here on Monday and prescribed azithromycin, cough syrup with minimal improvement.  Reports coughing a lot at night.  Not getting good sleep.  Feels like she needs something for her lungs.  She has taken prednisone in the past which helped.  She is only taken the cough medicine twice a day.  She has been using the inhaler as needed.   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)    Osteoporosis    Personal history of chemotherapy    Personal history of radiation therapy     Patient Active Problem List   Diagnosis Date Noted   Genetic testing 02/14/2018   History of breast cancer 01/23/2018   Family history of breast cancer    Upper abdominal pain 07/21/2016   Constipation 12/14/2013   IBS (irritable bowel syndrome) 12/14/2013   Breast cancer of upper-inner quadrant of left female breast (HCC) 12/15/2012   Gallstones 11/12/2010   Kidney stones 11/12/2010   Lung nodules 11/11/2010    Past Surgical History:  Procedure Laterality Date   BREAST BIOPSY Left 07/04/2020   COLUMNAR CELL AND FIBROADENOMATOID CHANGES WITH   BREAST LUMPECTOMY Left    CHOLECYSTECTOMY     MOUTH SURGERY  11/06/2013   2 implants put in   PARTIAL HYSTERECTOMY      OB History    No obstetric history on file.      Home Medications    Prior to Admission medications   Medication Sig Start Date End Date Taking? Authorizing Provider  predniSONE (DELTASONE) 20 MG tablet Take 2 tablets (40 mg total) by mouth daily with breakfast for 5 days. 06/10/23 06/15/23 Yes Pellegrino Kennard A, FNP  albuterol (VENTOLIN HFA) 108 (90 Base) MCG/ACT inhaler Inhale 1-2 puffs into the lungs every 6 (six) hours as needed for wheezing or shortness of breath. 06/06/23   Stalin Gruenberg A, FNP  azithromycin (ZITHROMAX Z-PAK) 250 MG tablet Take 2 tablets (500 mg total) by mouth daily for 1 day, THEN 1 tablet (250 mg total) daily for 4 days. 06/06/23 06/11/23  Dahlia Byes A, FNP  carvedilol (COREG) 6.25 MG tablet Take 6.25 mg by mouth 2 (two) times daily. 05/28/19   [provider]  chlorthalidone (HYGROTON) 25 MG tablet Take 25 mg by mouth daily.    [provider]  Diphenhydramine-PE-APAP (ALLERGY RELIEF PLUS SINUS PO) Take 1 tablet by mouth daily.    [provider]  Multiple Vitamin (MULTIVITAMIN) tablet Take 1 tablet by mouth daily.      [provider]  omeprazole (PRILOSEC) 20 MG capsule Take 1 capsule (20 mg total) by mouth in the  morning. Patient needs office visit for further refills 05/26/20   Esterwood, Amy S, PA-C  Probiotic Product (PROBIOTIC DAILY PO) Take by mouth.    [provider]  promethazine-dextromethorphan (PROMETHAZINE-DM) 6.25-15 MG/5ML syrup Take 5 mLs by mouth 2 (two) times daily as needed for cough. 06/06/23   Janace Aris, FNP    Family History Family History  Problem Relation Age of Onset   Lung cancer Father        died at 53ys   Diabetes type II Maternal Grandmother    Heart disease Maternal Grandmother    Uterine cancer Sister 23   Rheum arthritis Maternal Aunt    Kidney disease Maternal Aunt    Heart disease Maternal Uncle    Heart attack Maternal Grandfather    Cancer Paternal Grandmother        NOS - died young   Kidney  disease Maternal Aunt    Breast cancer Cousin 30       mat first cousin   Breast cancer Cousin        mat first cousin d. 62   Breast cancer Cousin        mat first cousin's daughter d. 39s   Colon cancer Neg Hx    Stomach cancer Neg Hx    Esophageal cancer Neg Hx    Rectal cancer Neg Hx     Social History Social History   Tobacco Use   Smoking status: Former    Current packs/day: 0.00    Types: Cigarettes    Quit date: 03/15/1998    Years since quitting: 25.2   Smokeless tobacco: Never  Vaping Use   Vaping status: Never Used  Substance Use Topics   Alcohol use: Yes    Comment: not very often   Drug use: No     Allergies   Cefdinir, Aspirin, Penicillins, and Sulfa antibiotics   Review of Systems Review of Systems  Respiratory:  Positive for cough.      Physical Exam Triage Vital Signs ED Triage Vitals  Encounter Vitals Group     BP 06/10/23 1040 133/80     Systolic BP Percentile --      Diastolic BP Percentile --      Pulse Rate 06/10/23 1040 76     Resp 06/10/23 1040 20     Temp 06/10/23 1040 98.3 F (36.8 C)     Temp Source 06/10/23 1040 Oral     SpO2 06/10/23 1040 95 %     Weight --      Height --      Head Circumference --      Peak Flow --      Pain Score 06/10/23 1042 2     Pain Loc --      Pain Education --      Exclude from Growth Chart --    No data found.  Updated Vital Signs BP 133/80 (BP Location: Right Arm)   Pulse 76   Temp 98.3 F (36.8 C) (Oral)   Resp 20   SpO2 95%   Visual Acuity Right Eye Distance:   Left Eye Distance:   Bilateral Distance:    Right Eye Near:   Left Eye Near:    Bilateral Near:     Physical Exam Constitutional:      Appearance: Normal appearance.  Cardiovascular:     Rate and Rhythm: Normal rate and regular rhythm.     Heart sounds: Normal heart sounds.  Pulmonary:  Effort: Pulmonary effort is normal.     Breath sounds: Normal breath sounds.  Skin:    General: Skin is warm and dry.   Neurological:     Mental Status: She is alert.  Psychiatric:        Mood and Affect: Mood normal.      UC Treatments / Results  Labs (all labs ordered are listed, but only abnormal results are displayed) Labs Reviewed - No data to display  EKG   Radiology No results found.  Procedures Procedures (including critical care time)  Medications Ordered in UC Medications - No data to display  Initial Impression / Assessment and Plan / UC Course  I have reviewed the triage vital signs and the nursing notes.  Pertinent labs & imaging results that were available during my care of the patient were reviewed by me and considered in my medical decision making (see chart for details).     Cough-prescribing prednisone as requested for cough, lung inflammation.  Recommend continue the other medications that were prescribed.  She can use the cough syrup every 4-6 hours as needed for cough Follow-up as needed Final Clinical Impressions(s) / UC Diagnoses   Final diagnoses:  Acute cough     Discharge Instructions      Take the prednisone as.  You can take the cough medicine that was previously prescribed every 4-6 hours. Rest, hydrate and follow-up as needed    ED Prescriptions     Medication Sig Dispense Auth. Provider   predniSONE (DELTASONE) 20 MG tablet Take 2 tablets (40 mg total) by mouth daily with breakfast for 5 days. 10 tablet Janace Aris, FNP      PDMP not reviewed this encounter.   Janace Aris, FNP 06/10/23 1239

## 2023-06-10 NOTE — ED Triage Notes (Signed)
 Seen Monday for cough and congestion. Was advised to return should symptoms not improve. Patient states she thinks she needs a round of prednisone. Given z-pack and an inhaler on Monday. Will take last antibiotic today.

## 2023-09-05 NOTE — H&P (Signed)
 ubjective Patient ID: Ana Wilson is a 68 y.o. female.     HPI   Returns for follow up discussion prior to right breast reduction. Current 38 D. Patient diagnosed with left breast cancer 2002. Patient underwent left lumpectomy SLN by Dr. Evalene Moats 12/06/2000. Final pathology showed 3 separate areas of IDC, the largest measuring 1.2 cm, grade 2, 0/2 SLN. The 2 larger tumors had prognostic profile obtained and these showed ER/PR+ and 1 of the tumors was HER-2 amplified. The other one was not. She completed adjuvant CMF chemotherapy, no trastuzumab, she then received radiation treatments and 5 years of tamoxifen.    Wt reports up 15 lb since time lumpectomy. Reports over last few years significant change in size of right breast feels it is continuing to grow in size. Reports pain in shoulder and from bra straps. Reports pain in neck and back. Reports no bra fits. Reports satisfied with left breast volume.   Genetic testing 2019 negative   MMG 03/2023 benign, Diagnostic right MMG 01/2023 completed for concern diffuse pain and enlarged size, also benign.   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   Retired from [employer], industrial/product work. Lives with spouse.   Review of Systems  Constitutional:  Positive for unexpected weight change.  HENT:  Positive for hearing loss.   Musculoskeletal:  Positive for arthralgias, back pain and neck pain.  All other systems reviewed and are negative.     Objective Physical Exam  Cardiovascular: Normal rate, regular rhythm and normal heart sounds.    Pulmonary/Chest Effort normal and breath sounds normal.    Skin   Fitzpatrick 6    Lymph: no palpable axillary adenopathy   Breasts: no palpable masses, grade 3 ptosis bilateral Right>>left volume Left breast scar UIQ SN to nipple R 32 L 26 cm BW R 21 L 16 cm Nipple to IMF R 13 L 9 cm   Assessment/Plan History left  breast cancer s/p lumpectomy SLN Adjuvant chemotherapy , Radiation   Reviewed reduction with anchor type scars, OP surgery, drain, post operative visits and limitations, recovery. Diminished sensation nipple and breast skin, risk of nipple loss, wound healing problems, asymmetry, incidental carcinoma, changes with wt gain/loss, aging, unacceptable cosmetic appearance reviewed. Reviewed scar maturation over months. Reviewed cannot assure cup size.    Reduction over right will leave right NAC higher than left. Reviewed to allow most symmetry would need mastopexy over left to allow for symmetric positioning of NAC. Reviewed increased risks complications over radiated breast including wound healing problems fat necrosis prolonged healing. Reviewed with aging will develop recurrent ptosis, will develop recurrent asymmetry as radiated breast will not develop ptosis at same pace as non radiated breast due to radiation fibrosis. Plan defer treatment to left breast.   Additional risks including but not limited to bleeding, seroma, hematoma, need for additional procedures, damage to adjacent structures, unacceptable cosmetic result, infection, blood clots in legs or lungs reviewed. Completed ASPS consent for breast reduction.    We reviewed in mirror ideal position NAC at left IMF. Discussed options to set this position lower in more ptotic position to approximate left or at [facility]. Patient prefers former. She understands with aging will continue to descend and have recurrent asymmetry.   Drain teaching completed. Rx for Norco given.   Earlis Ranks, MD [facility]  Office/ physician access line after hours [phone]

## 2023-09-06 ENCOUNTER — Encounter (HOSPITAL_BASED_OUTPATIENT_CLINIC_OR_DEPARTMENT_OTHER): Payer: Self-pay | Admitting: Plastic Surgery

## 2023-09-06 ENCOUNTER — Other Ambulatory Visit: Payer: Self-pay

## 2023-09-07 ENCOUNTER — Encounter (HOSPITAL_BASED_OUTPATIENT_CLINIC_OR_DEPARTMENT_OTHER)
Admission: RE | Admit: 2023-09-07 | Discharge: 2023-09-07 | Disposition: A | Discharge status: Home / Self Care | Source: Ambulatory Visit | Attending: Plastic Surgery | Admitting: Plastic Surgery

## 2023-09-07 ENCOUNTER — Ambulatory Visit: Admitting: Internal Medicine

## 2023-09-07 DIAGNOSIS — Z01818 Encounter for other preprocedural examination: Secondary | ICD-10-CM | POA: Diagnosis present

## 2023-09-07 LAB — BASIC METABOLIC PANEL WITH GFR
Anion gap: 10 (ref 5–15)
BUN: 15 mg/dL (ref 8–23)
CO2: 28 mmol/L (ref 22–32)
Calcium: 9.5 mg/dL (ref 8.9–10.3)
Chloride: 99 mmol/L (ref 98–111)
Creatinine, Ser: 1.06 mg/dL — ABNORMAL HIGH (ref 0.44–1.00)
GFR, Estimated: 58 mL/min — ABNORMAL LOW (ref >60)
Glucose, Bld: 81 mg/dL (ref 70–99)
Potassium: 3.9 mmol/L (ref 3.5–5.1)
Sodium: 137 mmol/L (ref 135–145)

## 2023-09-07 NOTE — Progress Notes (Signed)

## 2023-09-13 ENCOUNTER — Other Ambulatory Visit: Payer: Self-pay

## 2023-09-13 ENCOUNTER — Encounter (HOSPITAL_BASED_OUTPATIENT_CLINIC_OR_DEPARTMENT_OTHER)
Admission: RE | Disposition: A | Discharge status: Home / Self Care | Payer: Self-pay | Source: Home / Self Care | Attending: Plastic Surgery

## 2023-09-13 ENCOUNTER — Ambulatory Visit (HOSPITAL_BASED_OUTPATIENT_CLINIC_OR_DEPARTMENT_OTHER): Admitting: Anesthesiology

## 2023-09-13 ENCOUNTER — Encounter (HOSPITAL_BASED_OUTPATIENT_CLINIC_OR_DEPARTMENT_OTHER): Payer: Self-pay | Admitting: Plastic Surgery

## 2023-09-13 ENCOUNTER — Ambulatory Visit (HOSPITAL_BASED_OUTPATIENT_CLINIC_OR_DEPARTMENT_OTHER)
Admission: RE | Admit: 2023-09-13 | Discharge: 2023-09-13 | Disposition: A | Discharge status: Home / Self Care | Attending: Plastic Surgery | Admitting: Plastic Surgery

## 2023-09-13 DIAGNOSIS — D869 Sarcoidosis, unspecified: Secondary | ICD-10-CM | POA: Insufficient documentation

## 2023-09-13 DIAGNOSIS — M549 Dorsalgia, unspecified: Secondary | ICD-10-CM | POA: Diagnosis not present

## 2023-09-13 DIAGNOSIS — M25519 Pain in unspecified shoulder: Secondary | ICD-10-CM | POA: Insufficient documentation

## 2023-09-13 DIAGNOSIS — I509 Heart failure, unspecified: Secondary | ICD-10-CM | POA: Diagnosis not present

## 2023-09-13 DIAGNOSIS — I110 Hypertensive heart disease with heart failure: Secondary | ICD-10-CM | POA: Insufficient documentation

## 2023-09-13 DIAGNOSIS — M542 Cervicalgia: Secondary | ICD-10-CM | POA: Insufficient documentation

## 2023-09-13 DIAGNOSIS — K219 Gastro-esophageal reflux disease without esophagitis: Secondary | ICD-10-CM | POA: Insufficient documentation

## 2023-09-13 DIAGNOSIS — Z923 Personal history of irradiation: Secondary | ICD-10-CM | POA: Insufficient documentation

## 2023-09-13 DIAGNOSIS — G8929 Other chronic pain: Secondary | ICD-10-CM | POA: Diagnosis not present

## 2023-09-13 DIAGNOSIS — N651 Disproportion of reconstructed breast: Secondary | ICD-10-CM | POA: Diagnosis not present

## 2023-09-13 DIAGNOSIS — Z9221 Personal history of antineoplastic chemotherapy: Secondary | ICD-10-CM | POA: Insufficient documentation

## 2023-09-13 DIAGNOSIS — Z87891 Personal history of nicotine dependence: Secondary | ICD-10-CM | POA: Insufficient documentation

## 2023-09-13 DIAGNOSIS — Z79899 Other long term (current) drug therapy: Secondary | ICD-10-CM | POA: Insufficient documentation

## 2023-09-13 DIAGNOSIS — N6489 Other specified disorders of breast: Secondary | ICD-10-CM | POA: Insufficient documentation

## 2023-09-13 DIAGNOSIS — N62 Hypertrophy of breast: Secondary | ICD-10-CM | POA: Insufficient documentation

## 2023-09-13 DIAGNOSIS — I10 Essential (primary) hypertension: Secondary | ICD-10-CM

## 2023-09-13 DIAGNOSIS — Z853 Personal history of malignant neoplasm of breast: Secondary | ICD-10-CM | POA: Diagnosis not present

## 2023-09-13 HISTORY — DX: Sarcoidosis, unspecified: D86.9

## 2023-09-13 HISTORY — DX: Personal history of urinary calculi: Z87.442

## 2023-09-13 SURGERY — MAMMOPLASTY, REDUCTION
Anesthesia: General | Site: Breast | Laterality: Right

## 2023-09-13 MED ORDER — CHLORHEXIDINE GLUCONATE CLOTH 2 % EX PADS: 6.0000 | MEDICATED_PAD | Freq: Once | CUTANEOUS | Status: DC

## 2023-09-13 MED ORDER — HYDROMORPHONE HCL 1 MG/ML IJ SOLN
INTRAMUSCULAR | Status: AC
  Filled 2023-09-13: qty 0.5

## 2023-09-13 MED ORDER — MIDAZOLAM HCL 5 MG/5ML IJ SOLN
INTRAMUSCULAR | Status: DC | PRN
  Administered 2023-09-13: 1 mg via INTRAVENOUS

## 2023-09-13 MED ORDER — GABAPENTIN 300 MG PO CAPS
ORAL_CAPSULE | ORAL | Status: AC
  Filled 2023-09-13: qty 1

## 2023-09-13 MED ORDER — ROCURONIUM BROMIDE 100 MG/10ML IV SOLN
INTRAVENOUS | Status: DC | PRN
  Administered 2023-09-13: 60 mg via INTRAVENOUS

## 2023-09-13 MED ORDER — PROPOFOL 10 MG/ML IV BOLUS
INTRAVENOUS | Status: DC | PRN
  Administered 2023-09-13: 150 mg via INTRAVENOUS

## 2023-09-13 MED ORDER — DEXAMETHASONE SODIUM PHOSPHATE 4 MG/ML IJ SOLN
INTRAMUSCULAR | Status: DC | PRN
  Administered 2023-09-13: 5 mg via INTRAVENOUS

## 2023-09-13 MED ORDER — EPHEDRINE 5 MG/ML INJ
INTRAVENOUS | Status: AC
  Filled 2023-09-13: qty 5

## 2023-09-13 MED ORDER — HYDROMORPHONE HCL 1 MG/ML IJ SOLN
0.2500 mg | INTRAMUSCULAR | Status: DC | PRN
  Administered 2023-09-13 (×2): 0.5 mg via INTRAVENOUS

## 2023-09-13 MED ORDER — PHENYLEPHRINE HCL (PRESSORS) 10 MG/ML IV SOLN
INTRAVENOUS | Status: DC | PRN
Start: 2023-09-13 — End: 2023-09-13
  Administered 2023-09-13 (×3): 80 ug via INTRAVENOUS

## 2023-09-13 MED ORDER — EPHEDRINE SULFATE (PRESSORS) 50 MG/ML IJ SOLN
INTRAMUSCULAR | Status: DC | PRN
  Administered 2023-09-13: 5 mg via INTRAVENOUS

## 2023-09-13 MED ORDER — FENTANYL CITRATE (PF) 100 MCG/2ML IJ SOLN
INTRAMUSCULAR | Status: DC | PRN
  Administered 2023-09-13 (×2): 50 ug via INTRAVENOUS

## 2023-09-13 MED ORDER — MIDAZOLAM HCL 2 MG/2ML IJ SOLN
INTRAMUSCULAR | Status: AC
  Filled 2023-09-13: qty 2

## 2023-09-13 MED ORDER — BUPIVACAINE HCL (PF) 0.5 % IJ SOLN
INTRAMUSCULAR | Status: DC | PRN
  Administered 2023-09-13: 20 mL

## 2023-09-13 MED ORDER — PHENYLEPHRINE 80 MCG/ML (10ML) SYRINGE FOR IV PUSH (FOR BLOOD PRESSURE SUPPORT)
PREFILLED_SYRINGE | INTRAVENOUS | Status: AC
  Filled 2023-09-13: qty 10

## 2023-09-13 MED ORDER — ONDANSETRON HCL 4 MG/2ML IJ SOLN
INTRAMUSCULAR | Status: DC | PRN
  Administered 2023-09-13: 4 mg via INTRAVENOUS

## 2023-09-13 MED ORDER — DROPERIDOL 2.5 MG/ML IJ SOLN
0.6250 mg | Freq: Once | INTRAMUSCULAR | Status: AC | PRN
  Administered 2023-09-13: 0.625 mg via INTRAVENOUS

## 2023-09-13 MED ORDER — 0.9 % SODIUM CHLORIDE (POUR BTL) OPTIME
TOPICAL | Status: DC | PRN
  Administered 2023-09-13: 200 mL

## 2023-09-13 MED ORDER — ACETAMINOPHEN 500 MG PO TABS
1000.0000 mg | ORAL_TABLET | ORAL | Status: AC
  Administered 2023-09-13: 1000 mg via ORAL

## 2023-09-13 MED ORDER — ONDANSETRON HCL 4 MG/2ML IJ SOLN
INTRAMUSCULAR | Status: AC
  Filled 2023-09-13: qty 2

## 2023-09-13 MED ORDER — DEXAMETHASONE SODIUM PHOSPHATE 10 MG/ML IJ SOLN
INTRAMUSCULAR | Status: AC
  Filled 2023-09-13: qty 1

## 2023-09-13 MED ORDER — LACTATED RINGERS IV SOLN: INTRAVENOUS | Status: DC

## 2023-09-13 MED ORDER — GABAPENTIN 300 MG PO CAPS
300.0000 mg | ORAL_CAPSULE | ORAL | Status: AC
  Administered 2023-09-13: 300 mg via ORAL

## 2023-09-13 MED ORDER — ACETAMINOPHEN 500 MG PO TABS
ORAL_TABLET | ORAL | Status: AC
  Filled 2023-09-13: qty 2

## 2023-09-13 MED ORDER — DROPERIDOL 2.5 MG/ML IJ SOLN
INTRAMUSCULAR | Status: AC
  Filled 2023-09-13: qty 2

## 2023-09-13 MED ORDER — LIDOCAINE HCL (CARDIAC) PF 100 MG/5ML IV SOSY
PREFILLED_SYRINGE | INTRAVENOUS | Status: DC | PRN
  Administered 2023-09-13: 100 mg via INTRAVENOUS

## 2023-09-13 MED ORDER — CIPROFLOXACIN IN D5W 400 MG/200ML IV SOLN
INTRAVENOUS | Status: AC
  Filled 2023-09-13: qty 200

## 2023-09-13 MED ORDER — ATROPINE SULFATE 0.4 MG/ML IV SOLN
INTRAVENOUS | Status: AC
  Filled 2023-09-13: qty 1

## 2023-09-13 MED ORDER — CIPROFLOXACIN IN D5W 400 MG/200ML IV SOLN
400.0000 mg | INTRAVENOUS | Status: AC
  Administered 2023-09-13: 400 mg via INTRAVENOUS

## 2023-09-13 MED ORDER — FENTANYL CITRATE (PF) 100 MCG/2ML IJ SOLN
INTRAMUSCULAR | Status: AC
Start: 2023-09-13 — End: 2023-09-13
  Filled 2023-09-13: qty 2

## 2023-09-13 MED ORDER — ROCURONIUM BROMIDE 10 MG/ML (PF) SYRINGE
PREFILLED_SYRINGE | INTRAVENOUS | Status: AC
  Filled 2023-09-13: qty 10

## 2023-09-13 MED ORDER — SUGAMMADEX SODIUM 200 MG/2ML IV SOLN
INTRAVENOUS | Status: DC | PRN
  Administered 2023-09-13: 200 mg via INTRAVENOUS

## 2023-09-13 MED ORDER — SUCCINYLCHOLINE CHLORIDE 200 MG/10ML IV SOSY
PREFILLED_SYRINGE | INTRAVENOUS | Status: AC
  Filled 2023-09-13: qty 10

## 2023-09-13 MED ORDER — BUPIVACAINE HCL (PF) 0.5 % IJ SOLN
INTRAMUSCULAR | Status: AC
  Filled 2023-09-13: qty 30

## 2023-09-13 MED ORDER — LIDOCAINE 2% (20 MG/ML) 5 ML SYRINGE
INTRAMUSCULAR | Status: AC
  Filled 2023-09-13: qty 5

## 2023-09-13 SURGICAL SUPPLY — 45 items
BINDER BREAST 3XL (GAUZE/BANDAGES/DRESSINGS) IMPLANT
BINDER BREAST LRG (GAUZE/BANDAGES/DRESSINGS) IMPLANT
BINDER BREAST MEDIUM (GAUZE/BANDAGES/DRESSINGS) IMPLANT
BINDER BREAST XLRG (GAUZE/BANDAGES/DRESSINGS) IMPLANT
BINDER BREAST XXLRG (GAUZE/BANDAGES/DRESSINGS) IMPLANT
BLADE SURG 10 STRL SS (BLADE) ×4 IMPLANT
BNDG GAUZE DERMACEA FLUFF 4 (GAUZE/BANDAGES/DRESSINGS) ×2 IMPLANT
CANISTER SUCT 1200ML W/VALVE (MISCELLANEOUS) ×1 IMPLANT
CHLORAPREP W/TINT 26 (MISCELLANEOUS) ×2 IMPLANT
COVER BACK TABLE 60X90IN (DRAPES) ×1 IMPLANT
COVER MAYO STAND STRL (DRAPES) ×1 IMPLANT
DERMABOND ADVANCED .7 DNX12 (GAUZE/BANDAGES/DRESSINGS) ×2 IMPLANT
DRAIN CHANNEL 15F RND FF W/TCR (WOUND CARE) IMPLANT
DRAIN CHANNEL 19F RND (DRAIN) IMPLANT
DRAPE TOP ARMCOVERS (MISCELLANEOUS) ×1 IMPLANT
DRAPE U-SHAPE 76X120 STRL (DRAPES) ×1 IMPLANT
DRAPE UTILITY XL STRL (DRAPES) ×1 IMPLANT
ELECT COATED BLADE 2.86 ST (ELECTRODE) ×1 IMPLANT
ELECTRODE REM PT RTRN 9FT ADLT (ELECTROSURGICAL) ×1 IMPLANT
EVACUATOR SILICONE 100CC (DRAIN) IMPLANT
GAUZE PAD ABD 8X10 STRL (GAUZE/BANDAGES/DRESSINGS) ×2 IMPLANT
GLOVE BIO SURGEON STRL SZ 6 (GLOVE) ×2 IMPLANT
GOWN STRL REUS W/ TWL LRG LVL3 (GOWN DISPOSABLE) ×2 IMPLANT
MARKER SKIN DUAL TIP RULER LAB (MISCELLANEOUS) IMPLANT
NDL HYPO 25X1 1.5 SAFETY (NEEDLE) ×1 IMPLANT
NEEDLE HYPO 25X1 1.5 SAFETY (NEEDLE) ×1 IMPLANT
NS IRRIG 1000ML POUR BTL (IV SOLUTION) ×1 IMPLANT
PACK BASIN DAY SURGERY FS (CUSTOM PROCEDURE TRAY) ×1 IMPLANT
PENCIL SMOKE EVACUATOR (MISCELLANEOUS) ×1 IMPLANT
PIN SAFETY STERILE (MISCELLANEOUS) ×1 IMPLANT
SHEET MEDIUM DRAPE 40X70 STRL (DRAPES) ×1 IMPLANT
SLEEVE SCD COMPRESS KNEE MED (STOCKING) ×1 IMPLANT
SPONGE T-LAP 18X18 ~~LOC~~+RFID (SPONGE) ×3 IMPLANT
STAPLER SKIN PROX WIDE 3.9 (STAPLE) ×1 IMPLANT
SUT ETHILON 2 0 FS 18 (SUTURE) IMPLANT
SUT MNCRL AB 3-0 PS2 18 (SUTURE) IMPLANT
SUT MNCRL AB 4-0 PS2 18 (SUTURE) IMPLANT
SUT VIC AB 3-0 PS1 18XBRD (SUTURE) IMPLANT
SUT VIC AB 4-0 PS2 18 (SUTURE) IMPLANT
SYR BULB IRRIG 60ML STRL (SYRINGE) ×1 IMPLANT
SYR CONTROL 10ML LL (SYRINGE) ×1 IMPLANT
TOWEL GREEN STERILE FF (TOWEL DISPOSABLE) ×2 IMPLANT
TUBE CONNECTING 20X1/4 (TUBING) ×1 IMPLANT
UNDERPAD 30X36 HEAVY ABSORB (UNDERPADS AND DIAPERS) ×2 IMPLANT
YANKAUER SUCT BULB TIP NO VENT (SUCTIONS) ×1 IMPLANT

## 2023-09-13 NOTE — Transfer of Care (Signed)
 Immediate Anesthesia Transfer of Care Note  Patient: Ana Wilson  Procedure(s) Performed: MAMMOPLASTY, REDUCTION (Right: Breast)  Patient Location: PACU  Anesthesia Type:General  Level of Consciousness: awake, alert , oriented, drowsy, and patient cooperative  Airway & Oxygen Therapy: Patient Spontanous Breathing and Patient connected to face mask oxygen  Post-op Assessment: Report given to RN and Post -op Vital signs reviewed and stable  Post vital signs: Reviewed and stable  Last Vitals:  Vitals Value Taken Time  BP 144/67 09/13/23 09:45  Temp    Pulse 78 09/13/23 09:47  Resp 30 09/13/23 09:47  SpO2 96 % 09/13/23 09:47  Vitals shown include unfiled device data.  Last Pain:  Vitals:   09/13/23 0644  TempSrc: Temporal  PainSc: 0-No pain         Complications: No notable events documented.

## 2023-09-13 NOTE — Anesthesia Procedure Notes (Signed)
 Procedure Name: Intubation Date/Time: 09/13/2023 7:41 AM  Performed by: Emilio Rock BIRCH, CRNAPre-anesthesia Checklist: Patient identified, Emergency Drugs available, Suction available and Patient being monitored Patient Re-evaluated:Patient Re-evaluated prior to induction Oxygen Delivery Method: Circle system utilized Preoxygenation: Pre-oxygenation with 100% oxygen Induction Type: IV induction Ventilation: Mask ventilation without difficulty Laryngoscope Size: Mac and 3 Grade View: Grade I Tube type: Oral Tube size: 7.0 mm Number of attempts: 1 Airway Equipment and Method: Stylet and Oral airway Placement Confirmation: ETT inserted through vocal cords under direct vision, positive ETCO2 and breath sounds checked- equal and bilateral Secured at: 22 cm Tube secured with: Tape Dental Injury: Teeth and Oropharynx as per pre-operative assessment

## 2023-09-13 NOTE — Interval H&P Note (Signed)
 History and Physical Interval Note:  09/13/2023 6:48 AM  Ana Wilson  has presented today for surgery, with the diagnosis of history breast cancer post operative asymmetry breast macromastia right, chronic neck back shoulder pain.  The various methods of treatment have been discussed with the patient and family. After consideration of risks, benefits and other options for treatment, the patient has consented to  Procedure(s) with comments: MAMMOPLASTY, REDUCTION (Right) - * RIGHT BREAST REDUCTION * as a surgical intervention.  The patient's history has been reviewed, patient examined, no change in status, stable for surgery.  I have reviewed the patient's chart and labs.  Questions were answered to the patient's satisfaction.     Earlis Cassady Turano

## 2023-09-13 NOTE — Anesthesia Postprocedure Evaluation (Signed)
 Anesthesia Post Note  Patient: Ana Wilson  Procedure(s) Performed: MAMMOPLASTY, REDUCTION (Right: Breast)     Anesthesia Type: General Anesthetic complications: no   No notable events documented.  Last Vitals:  Vitals:   09/13/23 1300 09/13/23 1321  BP: 131/72   Pulse: 65 64  Resp: 16   Temp:    SpO2: 92% 99%    Last Pain:  Vitals:   09/13/23 1245  TempSrc:   PainSc: 0-No pain                 Vertell Row

## 2023-09-13 NOTE — Anesthesia Preprocedure Evaluation (Addendum)
 Anesthesia Evaluation  Patient identified by MRN, date of birth, ID band Patient awake    Reviewed: Allergy & Precautions, NPO status , Patient's Chart, lab work & pertinent test results, reviewed documented beta blocker date and time   History of Anesthesia Complications Negative for: history of anesthetic complications  Airway Mallampati: II  TM Distance: >3 FB Neck ROM: Full    Dental  (+) Upper Dentures, Missing,    Pulmonary former smoker   Pulmonary exam normal        Cardiovascular hypertension, Pt. on medications and Pt. on home beta blockers +CHF  Normal cardiovascular exam     Neuro/Psych negative neurological ROS     GI/Hepatic Neg liver ROS,GERD  Medicated,,  Endo/Other  negative endocrine ROS    Renal/GU negative Renal ROS  negative genitourinary   Musculoskeletal  (+) Arthritis ,    Abdominal   Peds  Hematology negative hematology ROS (+)   Anesthesia Other Findings history breast cancer post operative asymmetry breast macromastia right, chronic neck back shoulder pain  Reproductive/Obstetrics                             Anesthesia Physical Anesthesia Plan  ASA: 2  Anesthesia Plan: General   Post-op Pain Management: Tylenol PO (pre-op)*   Induction: Intravenous  PONV Risk Score and Plan: 3 and Treatment may vary due to age or medical condition, Ondansetron, Dexamethasone and Midazolam  Airway Management Planned: Oral ETT  Additional Equipment: None  Intra-op Plan:   Post-operative Plan: Extubation in OR  Informed Consent: I have reviewed the patients History and Physical, chart, labs and discussed the procedure including the risks, benefits and alternatives for the proposed anesthesia with the patient or authorized representative who has indicated his/her understanding and acceptance.     Dental advisory given  Plan Discussed with: CRNA  Anesthesia Plan  Comments:         Anesthesia Quick Evaluation

## 2023-09-13 NOTE — Anesthesia Procedure Notes (Signed)
 Procedure Name: Intubation Date/Time: 09/13/2023 7:41 AM  Performed by: Emilio Rock BIRCH, CRNAPre-anesthesia Checklist: Patient identified, Emergency Drugs available, Suction available and Patient being monitored Patient Re-evaluated:Patient Re-evaluated prior to induction Oxygen Delivery Method: Circle system utilized Preoxygenation: Pre-oxygenation with 100% oxygen Induction Type: IV induction Ventilation: Mask ventilation without difficulty Tube type: Oral Number of attempts: 1 Airway Equipment and Method: Stylet and Oral airway Placement Confirmation: ETT inserted through vocal cords under direct vision, positive ETCO2 and breath sounds checked- equal and bilateral Tube secured with: Tape Dental Injury: Teeth and Oropharynx as per pre-operative assessment

## 2023-09-13 NOTE — Discharge Instructions (Addendum)
**  No Tylenol before 1pm today.   Post Anesthesia Home Care Instructions  Activity: Get plenty of rest for the remainder of the day. A responsible individual must stay with you for 24 hours following the procedure.  For the next 24 hours, DO NOT: -Drive a car -Paediatric nurse -Drink alcoholic beverages -Take any medication unless instructed by your physician -Make any legal decisions or sign important papers.  Meals: Start with liquid foods such as gelatin or soup. Progress to regular foods as tolerated. Avoid greasy, spicy, heavy foods. If nausea and/or vomiting occur, drink only clear liquids until the nausea and/or vomiting subsides. Call your physician if vomiting continues.  Special Instructions/Symptoms: Your throat may feel dry or sore from the anesthesia or the breathing tube placed in your throat during surgery. If this causes discomfort, gargle with warm salt water. The discomfort should disappear within 24 hours.  If you had a scopolamine patch placed behind your ear for the management of post- operative nausea and/or vomiting:  1. The medication in the patch is effective for 72 hours, after which it should be removed.  Wrap patch in a tissue and discard in the trash. Wash hands thoroughly with soap and water. 2. You may remove the patch earlier than 72 hours if you experience unpleasant side effects which may include dry mouth, dizziness or visual disturbances. 3. Avoid touching the patch. Wash your hands with soap and water after contact with the patch.

## 2023-09-13 NOTE — Op Note (Signed)
 Operative Note   DATE OF OPERATION: 7.1.2025  LOCATION: [facility]-outpatient  SURGICAL DIVISION: Plastic Surgery  PREOPERATIVE DIAGNOSES:  1. History breast cancer 2. History therapeutic radiation 3. Post operative asymmetry breasts  POSTOPERATIVE DIAGNOSES:  same  PROCEDURE:  Right breast reduction  SURGEON: Earlis Ranks MD MBA  ASSISTANT: none  ANESTHESIA:  General.   EBL: 25 ml  COMPLICATIONS: None immediate.   INDICATIONS FOR PROCEDURE:  The patient, Ana Wilson, is a 68 y.o. female born on 10-31-1955, is here for treatment asymmetry breasts following left lumpectomy and radiation treatment.   FINDINGS: Right reduction 456 g  DESCRIPTION OF PROCEDURE:  The patient was marked standing in the preoperative area to mark sternal notch, chest midline, anterior axillary lines, inframammary folds. The location of new nipple areolar complex was marked symmetric with left breast. With aid of Wise pattern marker, location of new nipple areolar complex and vertical limbs (6 cm) were marked by displacement of breasts along meridian. The patient was taken to the operating room. SCDs were placed and IV antibiotics were given. The patient's operative site was prepped and draped in a sterile fashion. A time out was performed and all information was confirmed to be correct.     Over right breast, superior medial pedicle designed. NAC incised with 45 mm diameter marker. The pedicle was deepithelialized. Pedicle developed to chest wall. Breast tissue resected over lower pole. Medial and lateral flaps developed. Additional superior and lateral breast tissue excised. Breast tailor tacked closed. Patient brought to upright sitting position and assessed for symmetry. Patient returned to supine position. Breast cavity irrigated and hemostasis obtained. Local anesthetic infiltrated throughout breast. 15 Fr JP placed in each breast and secured with 2-0 nylon. Closure completed with interrupted  and short running 3-0 vicryl used to approximate dermis along remainder inframammary fold and vertical limb. NAC inset with 3-0 vicryl in dermis. Skin closure completed with 4-0 monocryl subcuticular throughout vertical limb and NAC. Skin closure completed with 3-0 monocryl along IMF. Tissue adhesive applied. Dry dressing and breast binder applied.   The patient was allowed to wake from anesthesia, extubated and taken to the recovery room in satisfactory condition.   SPECIMENS: right breast reduction  DRAINS: 19 Fr JP in right breast  Earlis Ranks, MD [facility]  Office/ physician access line after hours [phone]

## 2023-09-14 ENCOUNTER — Encounter (HOSPITAL_BASED_OUTPATIENT_CLINIC_OR_DEPARTMENT_OTHER): Payer: Self-pay | Admitting: Plastic Surgery

## 2023-09-14 LAB — SURGICAL PATHOLOGY

## 2023-12-24 ENCOUNTER — Ambulatory Visit (HOSPITAL_BASED_OUTPATIENT_CLINIC_OR_DEPARTMENT_OTHER): Payer: Self-pay

## 2023-12-26 ENCOUNTER — Ambulatory Visit (HOSPITAL_BASED_OUTPATIENT_CLINIC_OR_DEPARTMENT_OTHER)
Admission: EM | Admit: 2023-12-26 | Discharge: 2023-12-26 | Disposition: A | Discharge status: Home / Self Care | Attending: Family Medicine | Admitting: Family Medicine

## 2023-12-26 ENCOUNTER — Encounter (HOSPITAL_BASED_OUTPATIENT_CLINIC_OR_DEPARTMENT_OTHER): Payer: Self-pay | Admitting: Emergency Medicine

## 2023-12-26 DIAGNOSIS — R52 Pain, unspecified: Secondary | ICD-10-CM

## 2023-12-26 DIAGNOSIS — H66002 Acute suppurative otitis media without spontaneous rupture of ear drum, left ear: Secondary | ICD-10-CM | POA: Diagnosis not present

## 2023-12-26 DIAGNOSIS — J069 Acute upper respiratory infection, unspecified: Secondary | ICD-10-CM | POA: Diagnosis not present

## 2023-12-26 DIAGNOSIS — R051 Acute cough: Secondary | ICD-10-CM

## 2023-12-26 LAB — POC SOFIA SARS ANTIGEN FIA: SARS Coronavirus 2 Ag: NEGATIVE

## 2023-12-26 MED ORDER — PROMETHAZINE-DM 6.25-15 MG/5ML PO SYRP: 5.0000 mL | ORAL_SOLUTION | Freq: Four times a day (QID) | ORAL | 0 refills | Status: AC | PRN

## 2023-12-26 MED ORDER — DOXYCYCLINE HYCLATE 100 MG PO CAPS: 100.0000 mg | ORAL_CAPSULE | Freq: Two times a day (BID) | ORAL | 0 refills | Status: AC

## 2023-12-26 MED ORDER — FLUTICASONE PROPIONATE 50 MCG/ACT NA SUSP: 1.0000 | Freq: Two times a day (BID) | NASAL | 0 refills | Status: AC | PRN

## 2023-12-26 NOTE — ED Triage Notes (Signed)
 Pt reports since last Thursday. Reports cough, congestion, loss of voice. Pt taking Claritin D and sometimes cough is productive. Pt also co headaches.

## 2023-12-26 NOTE — Discharge Instructions (Addendum)
 Viral upper respiratory infection with left ear infection, cough and bodyaches: Rapid COVID is negative.  Fluticasone nasal spray, 1 spray into each nostril twice daily for nasal congestion.  When you use the nasal spray, do not sniffle or snort just sprayed into the nose and blot if it drips try to breathe through your mouth and let it dry in the nose.  Doxycycline 100 mg twice daily for 10 days for left ear infection.  Promethazine  DM, 5 mL, every 6 hours if needed for cough.  Get plenty of fluids and rest.  Try not to speak if possible.  The more you talk to the less likely you are to have a voice right now.  Resting your vocal cords helps improve the loss of your voice.  Use cold or warm fluids for comfort.  Take acetaminophen  or ibuprofen if needed for pain.  Follow-up if symptoms do not improve, worsen or new symptoms occur.

## 2023-12-26 NOTE — ED Provider Notes (Signed)
 [facility]    CSN: [id number] Arrival date & time: 12/26/23  1655      History   Chief Complaint Chief Complaint  Patient presents with   Nasal Congestion   Hoarse    HPI Ana Wilson is a 68 y.o. female.   68 year old female who reports some runny nose and cough with congestion and hoarse voice that started on Thursday, 12/22/2023.  On 12/25/2023, she had some bodyaches, headache, sore throat and even possibly a fever.  She denies nausea, vomiting, constipation, diarrhea.  The Claritin-D she has been using has been helpful with some of her symptoms but not all.     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)    Osteoporosis    Personal history of chemotherapy    Personal history of radiation therapy    Sarcoidosis     Patient Active Problem List   Diagnosis Date Noted   Genetic testing 02/14/2018   History of breast cancer 01/23/2018   Family history of breast cancer    Upper abdominal pain 07/21/2016   Constipation 12/14/2013   IBS (irritable bowel syndrome) 12/14/2013   Breast cancer of upper-inner quadrant of left female breast (HCC) 12/15/2012   Gallstones 11/12/2010   Kidney stones 11/12/2010   Lung nodules 11/11/2010    Past Surgical History:  Procedure Laterality Date   BREAST BIOPSY Left 07/04/2020   COLUMNAR CELL AND FIBROADENOMATOID CHANGES WITH   BREAST LUMPECTOMY Left    BREAST REDUCTION SURGERY Right 09/13/2023   Procedure: MAMMOPLASTY, REDUCTION;  Surgeon: Arelia Filippo, MD;  Location: [facility];   Service: Plastics;  Laterality: Right;  * RIGHT BREAST REDUCTION *   CHOLECYSTECTOMY     MOUTH SURGERY  11/06/2013   2 implants put in   PARTIAL HYSTERECTOMY      OB History   No obstetric history on file.      Home Medications    Prior to Admission medications   Medication Sig Start Date End Date Taking? Authorizing Provider  doxycycline (VIBRAMYCIN) 100 MG capsule Take 1 capsule (100 mg total) by mouth 2 (two) times daily for 10 days. 12/26/23 01/05/24 Yes Ival Domino, FNP  fluticasone (FLONASE) 50 MCG/ACT nasal spray Place 1 spray into both nostrils 2 (two) times daily as needed for rhinitis. 12/26/23 01/25/24 Yes Ival Domino, FNP  promethazine -dextromethorphan (PROMETHAZINE -DM) 6.25-15 MG/5ML syrup Take 5 mLs by mouth 4 (four) times daily as needed for cough. Do not use and drive - May make drowsy. 12/26/23  Yes Ival Domino, FNP  albuterol  (VENTOLIN  HFA) 108 (90 Base) MCG/ACT inhaler Inhale 1-2 puffs into the lungs every 6 (six) hours as needed for wheezing or shortness of breath. 06/06/23   Adah Corning A, FNP  carvedilol (COREG) 6.25 MG tablet Take 6.25 mg by mouth 2 (two) times daily. 05/28/19   [provider]  chlorthalidone (HYGROTON) 25 MG tablet Take 12.5 mg by mouth daily.    [provider]  Diphenhydramine-PE-APAP (ALLERGY RELIEF PLUS SINUS PO) Take  1 tablet by mouth daily.    [provider]  Multiple Vitamin (MULTIVITAMIN) tablet Take 1 tablet by mouth daily.      [provider]  omeprazole  (PRILOSEC) 20 MG capsule Take 1 capsule (20 mg total) by mouth in the morning. Patient needs office visit for further refills 05/26/20   Esterwood, Amy S, PA-C  Probiotic Product (PROBIOTIC DAILY PO) Take by mouth.    [provider]    Family History Family History  Problem Relation Age of Onset   Lung cancer Father        died at 96ys   Diabetes type II Maternal Grandmother    Heart disease Maternal Grandmother    Uterine cancer  Sister 70   Rheum arthritis Maternal Aunt    Kidney disease Maternal Aunt    Heart disease Maternal Uncle    Heart attack Maternal Grandfather    Cancer Paternal Grandmother        NOS - died young   Kidney disease Maternal Aunt    Breast cancer Cousin 63       mat first cousin   Breast cancer Cousin        mat first cousin d. 39   Breast cancer Cousin        mat first cousin's daughter d. 53s   Colon cancer Neg Hx    Stomach cancer Neg Hx    Esophageal cancer Neg Hx    Rectal cancer Neg Hx     Social History Social History   Tobacco Use   Smoking status: Former    Current packs/day: 0.00    Types: Cigarettes    Quit date: 03/15/1998    Years since quitting: 25.8   Smokeless tobacco: Never  Vaping Use   Vaping status: Never Used  Substance Use Topics   Alcohol use: Yes    Comment: not very often   Drug use: No     Allergies   Cefdinir, Aspirin, Lisinopril, Losartan, Penicillins, and Sulfa antibiotics   Review of Systems Review of Systems  Constitutional:  Positive for fever. Negative for chills.  HENT:  Positive for congestion, postnasal drip, rhinorrhea, sore throat and voice change. Negative for ear pain.   Eyes:  Negative for pain and visual disturbance.  Respiratory:  Positive for cough. Negative for shortness of breath.   Cardiovascular:  Negative for chest pain and palpitations.  Gastrointestinal:  Negative for abdominal pain, constipation, diarrhea, nausea and vomiting.  Genitourinary:  Negative for dysuria and hematuria.  Musculoskeletal:  Positive for arthralgias. Negative for back pain.  Skin:  Negative for color change and rash.  Neurological:  Positive for headaches. Negative for seizures and syncope.  All other systems reviewed and are negative.    Physical Exam Triage Vital Signs ED Triage Vitals  Encounter Vitals Group     BP 12/26/23 1709 110/75     Girls Systolic BP Percentile --      Girls Diastolic BP Percentile --      Boys Systolic  BP Percentile --      Boys Diastolic BP Percentile --      Pulse Rate 12/26/23 1709 92     Resp 12/26/23 1709 20     Temp 12/26/23 1709 98.5 F (36.9 C)     Temp Source 12/26/23 1709 Oral     SpO2 12/26/23 1709 94 %     Weight --      Height --      Head Circumference --  Peak Flow --      Pain Score 12/26/23 1708 6     Pain Loc --      Pain Education --      Exclude from Growth Chart --    No data found.  Updated Vital Signs BP 110/75 (BP Location: Right Arm)   Pulse 92   Temp 98.5 F (36.9 C) (Oral)   Resp 20   SpO2 94%   Visual Acuity Right Eye Distance:   Left Eye Distance:   Bilateral Distance:    Right Eye Near:   Left Eye Near:    Bilateral Near:     Physical Exam Vitals and nursing note reviewed.  Constitutional:      General: She is not in acute distress.    Appearance: She is well-developed. She is ill-appearing. She is not toxic-appearing or diaphoretic.  HENT:     Head: Normocephalic and atraumatic.     Right Ear: Hearing, tympanic membrane, ear canal and external ear normal.     Left Ear: Hearing, ear canal and external ear normal. A middle ear effusion is present. Tympanic membrane is erythematous and bulging.     Nose: Congestion and rhinorrhea present. Rhinorrhea is clear.     Right Sinus: No maxillary sinus tenderness or frontal sinus tenderness.     Left Sinus: No maxillary sinus tenderness or frontal sinus tenderness.     Mouth/Throat:     Lips: Pink.     Mouth: Mucous membranes are moist.     Pharynx: Uvula midline. Posterior oropharyngeal erythema present. No oropharyngeal exudate.     Tonsils: No tonsillar exudate.     Comments: Hoarse voice Eyes:     Conjunctiva/sclera: Conjunctivae normal.     Pupils: Pupils are equal, round, and reactive to light.  Cardiovascular:     Rate and Rhythm: Normal rate and regular rhythm.     Heart sounds: S1 normal and S2 normal. No murmur heard. Pulmonary:     Effort: Pulmonary effort is normal. No  respiratory distress.     Breath sounds: Normal breath sounds. No decreased breath sounds, wheezing, rhonchi or rales.  Abdominal:     General: Bowel sounds are normal.     Palpations: Abdomen is soft.     Tenderness: There is no abdominal tenderness.  Musculoskeletal:        General: No swelling.     Cervical back: Neck supple.  Lymphadenopathy:     Head:     Right side of head: No submental, submandibular, tonsillar, preauricular or posterior auricular adenopathy.     Left side of head: No submental, submandibular, tonsillar, preauricular or posterior auricular adenopathy.     Cervical: Cervical adenopathy present.     Right cervical: Superficial cervical adenopathy present.     Left cervical: Superficial cervical adenopathy present.  Skin:    General: Skin is warm and dry.     Capillary Refill: Capillary refill takes less than 2 seconds.     Findings: No rash.  Neurological:     Mental Status: She is alert and oriented to person, place, and time.  Psychiatric:        Mood and Affect: Mood normal.      UC Treatments / Results  Labs (all labs ordered are listed, but only abnormal results are displayed) Labs Reviewed  POC SOFIA SARS ANTIGEN FIA - Normal    EKG   Radiology No results found.  Procedures Procedures (including critical care time)  Medications Ordered in  UC Medications - No data to display  Initial Impression / Assessment and Plan / UC Course  I have reviewed the triage vital signs and the nursing notes.  Pertinent labs & imaging results that were available during my care of the patient were reviewed by me and considered in my medical decision making (see chart for details).  Plan of Care: Viral upper respiratory infection with left ear infection, cough and bodyaches: Rapid COVID is negative.  Fluticasone nasal spray, 1 spray into each nostril twice daily for nasal congestion.  When you use the nasal spray, do not sniffle or snort just sprayed into the  nose and blot if it drips try to breathe through your mouth and let it dry in the nose.  Doxycycline 100 mg twice daily for 10 days for left ear infection.  Promethazine  DM, 5 mL, every 6 hours if needed for cough.  Get plenty of fluids and rest.  Try not to speak if possible.  The more you talk to the less likely you are to have a voice right now.  Resting your vocal cords helps improve the loss of your voice.  Use cold or warm fluids for comfort.  Take acetaminophen  or ibuprofen if needed for pain.  Follow-up if symptoms do not improve, worsen or new symptoms occur.  I reviewed the plan of care with the patient and/or the patient's guardian.  The patient and/or guardian had time to ask questions and acknowledged that the questions were answered.  I provided instruction on symptoms or reasons to return here or to go to an ER, if symptoms/condition did not improve, worsened or if new symptoms occurred.  Final Clinical Impressions(s) / UC Diagnoses   Final diagnoses:  Acute cough  Body aches  Non-recurrent acute suppurative otitis media of left ear without spontaneous rupture of tympanic membrane  Viral URI with cough     Discharge Instructions      Viral upper respiratory infection with left ear infection, cough and bodyaches: Rapid COVID is negative.  Fluticasone nasal spray, 1 spray into each nostril twice daily for nasal congestion.  When you use the nasal spray, do not sniffle or snort just sprayed into the nose and blot if it drips try to breathe through your mouth and let it dry in the nose.  Doxycycline 100 mg twice daily for 10 days for left ear infection.  Promethazine  DM, 5 mL, every 6 hours if needed for cough.  Get plenty of fluids and rest.  Try not to speak if possible.  The more you talk to the less likely you are to have a voice right now.  Resting your vocal cords helps improve the loss of your voice.  Use cold or warm fluids for comfort.  Take acetaminophen  or ibuprofen if  needed for pain.  Follow-up if symptoms do not improve, worsen or new symptoms occur.     ED Prescriptions     Medication Sig Dispense Auth. Provider   doxycycline (VIBRAMYCIN) 100 MG capsule Take 1 capsule (100 mg total) by mouth 2 (two) times daily for 10 days. 20 capsule Ival Domino, FNP   fluticasone (FLONASE) 50 MCG/ACT nasal spray Place 1 spray into both nostrils 2 (two) times daily as needed for rhinitis. 17 mL Ival Domino, FNP   promethazine -dextromethorphan (PROMETHAZINE -DM) 6.25-15 MG/5ML syrup Take 5 mLs by mouth 4 (four) times daily as needed for cough. Do not use and drive - May make drowsy. 118 mL Ival Domino, FNP  PDMP not reviewed this encounter.   Ival Domino, FNP 12/26/23 [phone]

## 2024-01-11 ENCOUNTER — Encounter: Payer: Self-pay | Admitting: Audiology

## 2024-01-27 ENCOUNTER — Other Ambulatory Visit: Payer: Self-pay | Admitting: Nurse Practitioner

## 2024-01-27 DIAGNOSIS — Z1231 Encounter for screening mammogram for malignant neoplasm of breast: Secondary | ICD-10-CM

## 2024-04-12 ENCOUNTER — Ambulatory Visit

## 2024-04-13 ENCOUNTER — Ambulatory Visit
Admission: RE | Admit: 2024-04-13 | Discharge: 2024-04-13 | Disposition: A | Source: Ambulatory Visit | Attending: Nurse Practitioner | Admitting: Nurse Practitioner

## 2024-04-13 DIAGNOSIS — Z1231 Encounter for screening mammogram for malignant neoplasm of breast: Secondary | ICD-10-CM
